# Patient Record
Sex: Male | Born: 1950 | Race: White | Hispanic: No | Marital: Married | Smoking: Former smoker
Health system: Southern US, Community
[De-identification: ages and names within clinical notes are randomized; demographics above are authoritative.]

## PROBLEM LIST (undated history)

## (undated) DIAGNOSIS — E119 Type 2 diabetes mellitus without complications: Secondary | ICD-10-CM

## (undated) DIAGNOSIS — I10 Essential (primary) hypertension: Secondary | ICD-10-CM

## (undated) DIAGNOSIS — E785 Hyperlipidemia, unspecified: Secondary | ICD-10-CM

## (undated) HISTORY — PX: BACK SURGERY: SHX140

## (undated) SURGERY — Surgical Case
Anesthesia: *Unknown

---

## 1998-04-12 ENCOUNTER — Encounter: Payer: Self-pay | Admitting: Orthopedic Surgery

## 1998-04-19 ENCOUNTER — Observation Stay (HOSPITAL_COMMUNITY): Admission: RE | Admit: 1998-04-19 | Discharge: 1998-04-20 | Payer: Self-pay | Admitting: Orthopedic Surgery

## 1998-04-19 ENCOUNTER — Encounter: Payer: Self-pay | Admitting: Orthopedic Surgery

## 2002-09-19 ENCOUNTER — Ambulatory Visit (HOSPITAL_COMMUNITY): Admission: RE | Admit: 2002-09-19 | Discharge: 2002-09-19 | Payer: Self-pay | Admitting: Gastroenterology

## 2010-02-18 ENCOUNTER — Observation Stay (HOSPITAL_COMMUNITY): Admission: EM | Admit: 2010-02-18 | Discharge: 2010-02-20 | Payer: Self-pay | Admitting: Emergency Medicine

## 2010-02-19 ENCOUNTER — Encounter (INDEPENDENT_AMBULATORY_CARE_PROVIDER_SITE_OTHER): Payer: Self-pay | Admitting: Internal Medicine

## 2010-02-19 ENCOUNTER — Ambulatory Visit: Payer: Self-pay | Admitting: Vascular Surgery

## 2010-09-30 LAB — CBC
HCT: 39.8 % (ref 39.0–52.0)
Hemoglobin: 12.9 g/dL — ABNORMAL LOW (ref 13.0–17.0)
Hemoglobin: 13.9 g/dL (ref 13.0–17.0)
MCH: 30.3 pg (ref 26.0–34.0)
MCHC: 34.9 g/dL (ref 30.0–36.0)
MCV: 86.9 fL (ref 78.0–100.0)
Platelets: 177 10*3/uL (ref 150–400)
Platelets: 182 10*3/uL (ref 150–400)
RBC: 4.32 MIL/uL (ref 4.22–5.81)
RBC: 4.58 MIL/uL (ref 4.22–5.81)
RDW: 12.7 % (ref 11.5–15.5)
WBC: 6.6 10*3/uL (ref 4.0–10.5)
WBC: 6.8 10*3/uL (ref 4.0–10.5)

## 2010-09-30 LAB — COMPREHENSIVE METABOLIC PANEL
ALT: 18 U/L (ref 0–53)
AST: 16 U/L (ref 0–37)
Albumin: 3.3 g/dL — ABNORMAL LOW (ref 3.5–5.2)
CO2: 26 mEq/L (ref 19–32)
Chloride: 108 mEq/L (ref 96–112)
GFR calc Af Amer: >60 mL/min (ref >60)
GFR calc non Af Amer: >60 mL/min (ref >60)
Sodium: 141 mEq/L (ref 135–145)
Total Bilirubin: 0.6 mg/dL (ref 0.3–1.2)

## 2010-09-30 LAB — LIPID PANEL
Cholesterol: 92 mg/dL (ref 0–200)
HDL: 28 mg/dL — ABNORMAL LOW (ref >39)
Total CHOL/HDL Ratio: 3.3 RATIO
Triglycerides: 145 mg/dL (ref <150)

## 2010-09-30 LAB — URINALYSIS, ROUTINE W REFLEX MICROSCOPIC
Hgb urine dipstick: NEGATIVE
Leukocytes, UA: NEGATIVE
Nitrite: NEGATIVE
Specific Gravity, Urine: 1.031 — ABNORMAL HIGH (ref 1.005–1.030)
Urobilinogen, UA: 1 mg/dL (ref 0.0–1.0)

## 2010-09-30 LAB — BASIC METABOLIC PANEL
BUN: 19 mg/dL (ref 6–23)
CO2: 25 mEq/L (ref 19–32)
Calcium: 9.7 mg/dL (ref 8.4–10.5)
Chloride: 104 mEq/L (ref 96–112)
Creatinine, Ser: 1.19 mg/dL (ref 0.4–1.5)
GFR calc Af Amer: >60 mL/min (ref >60)
GFR calc non Af Amer: >60 mL/min (ref >60)
Glucose, Bld: 294 mg/dL — ABNORMAL HIGH (ref 70–99)
Potassium: 4 mEq/L (ref 3.5–5.1)
Sodium: 137 mEq/L (ref 135–145)

## 2010-09-30 LAB — CK TOTAL AND CKMB (NOT AT ARMC)
CK, MB: 5.5 ng/mL — ABNORMAL HIGH (ref 0.3–4.0)
Relative Index: 2 (ref 0.0–2.5)

## 2010-09-30 LAB — RAPID URINE DRUG SCREEN, HOSP PERFORMED
Amphetamines: NOT DETECTED
Barbiturates: NOT DETECTED
Tetrahydrocannabinol: NOT DETECTED

## 2010-09-30 LAB — DIFFERENTIAL
Basophils Absolute: 0 10*3/uL (ref 0.0–0.1)
Basophils Relative: 0 % (ref 0–1)
Eosinophils Absolute: 0.1 10*3/uL (ref 0.0–0.7)
Eosinophils Relative: 2 % (ref 0–5)
Lymphocytes Relative: 22 % (ref 12–46)
Lymphs Abs: 1.5 10*3/uL (ref 0.7–4.0)
Monocytes Absolute: 0.6 10*3/uL (ref 0.1–1.0)
Monocytes Relative: 9 % (ref 3–12)
Neutro Abs: 4.5 10*3/uL (ref 1.7–7.7)
Neutrophils Relative %: 67 % (ref 43–77)

## 2010-09-30 LAB — CARDIAC PANEL(CRET KIN+CKTOT+MB+TROPI)
CK, MB: 3.8 ng/mL (ref 0.3–4.0)
Relative Index: 1.9 (ref 0.0–2.5)
Total CK: 200 U/L (ref 7–232)

## 2010-09-30 LAB — TROPONIN I: Troponin I: 0.01 ng/mL (ref 0.00–0.06)

## 2010-09-30 LAB — TSH: TSH: 0.992 u[IU]/mL (ref 0.350–4.500)

## 2010-09-30 LAB — GLUCOSE, CAPILLARY
Glucose-Capillary: 117 mg/dL — ABNORMAL HIGH (ref 70–99)
Glucose-Capillary: 178 mg/dL — ABNORMAL HIGH (ref 70–99)
Glucose-Capillary: 183 mg/dL — ABNORMAL HIGH (ref 70–99)
Glucose-Capillary: 201 mg/dL — ABNORMAL HIGH (ref 70–99)

## 2010-09-30 LAB — URINE MICROSCOPIC-ADD ON

## 2010-09-30 LAB — POCT CARDIAC MARKERS: Troponin i, poc: <0.05 ng/mL (ref 0.00–0.09)

## 2010-12-02 NOTE — Op Note (Signed)
   Aaron [name], [name]                        ACCOUNT NO.:  [id number]   MEDICAL RECORD NO.:  [id number]                   PATIENT TYPE:  AMB   LOCATION:  ENDO                                 FACILITY:  [facility]   PHYSICIAN:  James L. Malon Kindle., M.D.          DATE OF BIRTH:  11-Sep-1950   DATE OF PROCEDURE:  09/19/2002  DATE OF DISCHARGE:                                 OPERATIVE REPORT   PROCEDURE:  Colonoscopy.   MEDICATIONS:  Fentanyl 100 mcg, Versed 8 mg IV.   SCOPE:  Olympus pediatric colonoscope.   INDICATIONS FOR PROCEDURE:  Colon cancer screening.   DESCRIPTION OF PROCEDURE:  The procedure had been explained to the patient  and consent obtained. With the patient in the left lateral decubitus  position, a digital exam was performed and the scope inserted and advanced.  We easily reached the cecum without difficulty, ileocecal valve and  appendiceal orifice seen. Scope withdrawn. The cecum, ascending colon,  transverse colon, descending and sigmoid colon were seen well with no  significant diverticular disease. The rectum was free of polyps. The scope  was withdrawn. The patient tolerated the procedure well.   ASSESSMENT:  1. Normal screening colonoscopy.   PLAN:  Routine followup with yearly Hemoccults, possibly screen the colon  again in 10 years.                                               James L. Malon Kindle., M.D.    Waldron Session  D:  09/19/2002  T:  09/19/2002  Job:  [id number]   cc:   Oley Balm. Georgina Pillion, M.D.  [address]  [place]  [place] [address]  Fax: [phone]

## 2011-06-06 ENCOUNTER — Ambulatory Visit (INDEPENDENT_AMBULATORY_CARE_PROVIDER_SITE_OTHER): Payer: 59 | Admitting: Ophthalmology

## 2011-06-06 DIAGNOSIS — H2510 Age-related nuclear cataract, unspecified eye: Secondary | ICD-10-CM

## 2011-06-06 DIAGNOSIS — H43819 Vitreous degeneration, unspecified eye: Secondary | ICD-10-CM

## 2011-06-06 DIAGNOSIS — E1165 Type 2 diabetes mellitus with hyperglycemia: Secondary | ICD-10-CM

## 2011-06-06 DIAGNOSIS — E11319 Type 2 diabetes mellitus with unspecified diabetic retinopathy without macular edema: Secondary | ICD-10-CM

## 2011-12-04 ENCOUNTER — Ambulatory Visit (INDEPENDENT_AMBULATORY_CARE_PROVIDER_SITE_OTHER): Payer: 59 | Admitting: Ophthalmology

## 2011-12-04 DIAGNOSIS — E1139 Type 2 diabetes mellitus with other diabetic ophthalmic complication: Secondary | ICD-10-CM

## 2011-12-04 DIAGNOSIS — H43819 Vitreous degeneration, unspecified eye: Secondary | ICD-10-CM

## 2011-12-04 DIAGNOSIS — H2510 Age-related nuclear cataract, unspecified eye: Secondary | ICD-10-CM

## 2011-12-04 DIAGNOSIS — E11319 Type 2 diabetes mellitus with unspecified diabetic retinopathy without macular edema: Secondary | ICD-10-CM

## 2012-06-05 ENCOUNTER — Ambulatory Visit (INDEPENDENT_AMBULATORY_CARE_PROVIDER_SITE_OTHER): Payer: 59 | Admitting: Ophthalmology

## 2012-06-12 ENCOUNTER — Ambulatory Visit (INDEPENDENT_AMBULATORY_CARE_PROVIDER_SITE_OTHER): Payer: 59 | Admitting: Ophthalmology

## 2012-06-26 ENCOUNTER — Ambulatory Visit (INDEPENDENT_AMBULATORY_CARE_PROVIDER_SITE_OTHER): Payer: 59 | Admitting: Ophthalmology

## 2012-06-26 DIAGNOSIS — H35039 Hypertensive retinopathy, unspecified eye: Secondary | ICD-10-CM

## 2012-06-26 DIAGNOSIS — H43819 Vitreous degeneration, unspecified eye: Secondary | ICD-10-CM

## 2012-06-26 DIAGNOSIS — E11319 Type 2 diabetes mellitus with unspecified diabetic retinopathy without macular edema: Secondary | ICD-10-CM

## 2012-06-26 DIAGNOSIS — E1165 Type 2 diabetes mellitus with hyperglycemia: Secondary | ICD-10-CM

## 2012-06-26 DIAGNOSIS — H2510 Age-related nuclear cataract, unspecified eye: Secondary | ICD-10-CM

## 2012-06-26 DIAGNOSIS — I10 Essential (primary) hypertension: Secondary | ICD-10-CM

## 2012-12-25 ENCOUNTER — Ambulatory Visit (INDEPENDENT_AMBULATORY_CARE_PROVIDER_SITE_OTHER): Payer: PRIVATE HEALTH INSURANCE | Admitting: Ophthalmology

## 2012-12-25 DIAGNOSIS — D3130 Benign neoplasm of unspecified choroid: Secondary | ICD-10-CM

## 2012-12-25 DIAGNOSIS — I10 Essential (primary) hypertension: Secondary | ICD-10-CM

## 2012-12-25 DIAGNOSIS — H35039 Hypertensive retinopathy, unspecified eye: Secondary | ICD-10-CM

## 2012-12-25 DIAGNOSIS — E11319 Type 2 diabetes mellitus with unspecified diabetic retinopathy without macular edema: Secondary | ICD-10-CM

## 2012-12-25 DIAGNOSIS — H2510 Age-related nuclear cataract, unspecified eye: Secondary | ICD-10-CM

## 2012-12-25 DIAGNOSIS — E1165 Type 2 diabetes mellitus with hyperglycemia: Secondary | ICD-10-CM

## 2013-06-26 ENCOUNTER — Ambulatory Visit (INDEPENDENT_AMBULATORY_CARE_PROVIDER_SITE_OTHER): Payer: BC Managed Care – PPO | Admitting: Ophthalmology

## 2013-06-26 DIAGNOSIS — E11319 Type 2 diabetes mellitus with unspecified diabetic retinopathy without macular edema: Secondary | ICD-10-CM

## 2013-06-26 DIAGNOSIS — D3130 Benign neoplasm of unspecified choroid: Secondary | ICD-10-CM

## 2013-06-26 DIAGNOSIS — H43819 Vitreous degeneration, unspecified eye: Secondary | ICD-10-CM

## 2013-06-26 DIAGNOSIS — E1139 Type 2 diabetes mellitus with other diabetic ophthalmic complication: Secondary | ICD-10-CM

## 2013-06-26 DIAGNOSIS — H35039 Hypertensive retinopathy, unspecified eye: Secondary | ICD-10-CM

## 2013-06-26 DIAGNOSIS — I10 Essential (primary) hypertension: Secondary | ICD-10-CM

## 2013-12-25 ENCOUNTER — Ambulatory Visit (INDEPENDENT_AMBULATORY_CARE_PROVIDER_SITE_OTHER): Payer: BC Managed Care – PPO | Admitting: Ophthalmology

## 2014-01-05 ENCOUNTER — Ambulatory Visit (INDEPENDENT_AMBULATORY_CARE_PROVIDER_SITE_OTHER): Payer: BC Managed Care – PPO | Admitting: Ophthalmology

## 2014-01-05 DIAGNOSIS — H43819 Vitreous degeneration, unspecified eye: Secondary | ICD-10-CM

## 2014-01-05 DIAGNOSIS — H35039 Hypertensive retinopathy, unspecified eye: Secondary | ICD-10-CM

## 2014-01-05 DIAGNOSIS — E1165 Type 2 diabetes mellitus with hyperglycemia: Secondary | ICD-10-CM

## 2014-01-05 DIAGNOSIS — I10 Essential (primary) hypertension: Secondary | ICD-10-CM

## 2014-01-05 DIAGNOSIS — D3130 Benign neoplasm of unspecified choroid: Secondary | ICD-10-CM

## 2014-01-05 DIAGNOSIS — E1139 Type 2 diabetes mellitus with other diabetic ophthalmic complication: Secondary | ICD-10-CM

## 2014-01-05 DIAGNOSIS — E11319 Type 2 diabetes mellitus with unspecified diabetic retinopathy without macular edema: Secondary | ICD-10-CM

## 2014-07-07 ENCOUNTER — Ambulatory Visit (INDEPENDENT_AMBULATORY_CARE_PROVIDER_SITE_OTHER): Payer: Medicare Other | Admitting: Ophthalmology

## 2014-07-07 DIAGNOSIS — D3132 Benign neoplasm of left choroid: Secondary | ICD-10-CM | POA: Diagnosis not present

## 2014-07-07 DIAGNOSIS — H43813 Vitreous degeneration, bilateral: Secondary | ICD-10-CM | POA: Diagnosis not present

## 2014-07-07 DIAGNOSIS — H2513 Age-related nuclear cataract, bilateral: Secondary | ICD-10-CM | POA: Diagnosis not present

## 2014-07-07 DIAGNOSIS — I10 Essential (primary) hypertension: Secondary | ICD-10-CM | POA: Diagnosis not present

## 2014-07-07 DIAGNOSIS — E11329 Type 2 diabetes mellitus with mild nonproliferative diabetic retinopathy without macular edema: Secondary | ICD-10-CM | POA: Diagnosis not present

## 2014-07-07 DIAGNOSIS — E1165 Type 2 diabetes mellitus with hyperglycemia: Secondary | ICD-10-CM | POA: Diagnosis not present

## 2014-07-07 DIAGNOSIS — H35033 Hypertensive retinopathy, bilateral: Secondary | ICD-10-CM | POA: Diagnosis not present

## 2014-08-19 DIAGNOSIS — E1165 Type 2 diabetes mellitus with hyperglycemia: Secondary | ICD-10-CM | POA: Diagnosis not present

## 2015-01-06 ENCOUNTER — Ambulatory Visit (INDEPENDENT_AMBULATORY_CARE_PROVIDER_SITE_OTHER): Payer: Medicare Other | Admitting: Ophthalmology

## 2015-01-06 DIAGNOSIS — H43813 Vitreous degeneration, bilateral: Secondary | ICD-10-CM

## 2015-01-06 DIAGNOSIS — I10 Essential (primary) hypertension: Secondary | ICD-10-CM

## 2015-01-06 DIAGNOSIS — D3132 Benign neoplasm of left choroid: Secondary | ICD-10-CM

## 2015-01-06 DIAGNOSIS — E11329 Type 2 diabetes mellitus with mild nonproliferative diabetic retinopathy without macular edema: Secondary | ICD-10-CM

## 2015-01-06 DIAGNOSIS — E11339 Type 2 diabetes mellitus with moderate nonproliferative diabetic retinopathy without macular edema: Secondary | ICD-10-CM | POA: Diagnosis not present

## 2015-01-06 DIAGNOSIS — E11311 Type 2 diabetes mellitus with unspecified diabetic retinopathy with macular edema: Secondary | ICD-10-CM

## 2015-03-10 DIAGNOSIS — E780 Pure hypercholesterolemia: Secondary | ICD-10-CM | POA: Diagnosis not present

## 2015-03-10 DIAGNOSIS — E1165 Type 2 diabetes mellitus with hyperglycemia: Secondary | ICD-10-CM | POA: Diagnosis not present

## 2015-03-10 DIAGNOSIS — E11319 Type 2 diabetes mellitus with unspecified diabetic retinopathy without macular edema: Secondary | ICD-10-CM | POA: Diagnosis not present

## 2015-03-10 DIAGNOSIS — I10 Essential (primary) hypertension: Secondary | ICD-10-CM | POA: Diagnosis not present

## 2015-03-11 DIAGNOSIS — E780 Pure hypercholesterolemia: Secondary | ICD-10-CM | POA: Diagnosis not present

## 2015-03-11 DIAGNOSIS — E1165 Type 2 diabetes mellitus with hyperglycemia: Secondary | ICD-10-CM | POA: Diagnosis not present

## 2015-07-08 ENCOUNTER — Ambulatory Visit (INDEPENDENT_AMBULATORY_CARE_PROVIDER_SITE_OTHER): Payer: Medicare Other | Admitting: Ophthalmology

## 2015-07-28 ENCOUNTER — Ambulatory Visit (INDEPENDENT_AMBULATORY_CARE_PROVIDER_SITE_OTHER): Payer: Medicare Other | Admitting: Ophthalmology

## 2015-07-29 DIAGNOSIS — E1165 Type 2 diabetes mellitus with hyperglycemia: Secondary | ICD-10-CM | POA: Diagnosis not present

## 2015-07-29 DIAGNOSIS — E11319 Type 2 diabetes mellitus with unspecified diabetic retinopathy without macular edema: Secondary | ICD-10-CM | POA: Diagnosis not present

## 2015-07-29 DIAGNOSIS — I10 Essential (primary) hypertension: Secondary | ICD-10-CM | POA: Diagnosis not present

## 2015-07-29 DIAGNOSIS — E7800 Pure hypercholesterolemia, unspecified: Secondary | ICD-10-CM | POA: Diagnosis not present

## 2015-08-11 ENCOUNTER — Ambulatory Visit (INDEPENDENT_AMBULATORY_CARE_PROVIDER_SITE_OTHER): Payer: Medicare Other | Admitting: Ophthalmology

## 2015-08-11 DIAGNOSIS — E11319 Type 2 diabetes mellitus with unspecified diabetic retinopathy without macular edema: Secondary | ICD-10-CM | POA: Diagnosis not present

## 2015-08-11 DIAGNOSIS — H35033 Hypertensive retinopathy, bilateral: Secondary | ICD-10-CM

## 2015-08-11 DIAGNOSIS — D3132 Benign neoplasm of left choroid: Secondary | ICD-10-CM

## 2015-08-11 DIAGNOSIS — H2513 Age-related nuclear cataract, bilateral: Secondary | ICD-10-CM | POA: Diagnosis not present

## 2015-08-11 DIAGNOSIS — H43813 Vitreous degeneration, bilateral: Secondary | ICD-10-CM

## 2015-08-11 DIAGNOSIS — I10 Essential (primary) hypertension: Secondary | ICD-10-CM

## 2015-08-11 DIAGNOSIS — E113393 Type 2 diabetes mellitus with moderate nonproliferative diabetic retinopathy without macular edema, bilateral: Secondary | ICD-10-CM

## 2015-11-03 DIAGNOSIS — E7800 Pure hypercholesterolemia, unspecified: Secondary | ICD-10-CM | POA: Diagnosis not present

## 2015-11-03 DIAGNOSIS — E1165 Type 2 diabetes mellitus with hyperglycemia: Secondary | ICD-10-CM | POA: Diagnosis not present

## 2015-11-03 DIAGNOSIS — E11319 Type 2 diabetes mellitus with unspecified diabetic retinopathy without macular edema: Secondary | ICD-10-CM | POA: Diagnosis not present

## 2015-11-03 DIAGNOSIS — I10 Essential (primary) hypertension: Secondary | ICD-10-CM | POA: Diagnosis not present

## 2015-11-10 DIAGNOSIS — E1165 Type 2 diabetes mellitus with hyperglycemia: Secondary | ICD-10-CM | POA: Diagnosis not present

## 2016-02-09 ENCOUNTER — Ambulatory Visit (INDEPENDENT_AMBULATORY_CARE_PROVIDER_SITE_OTHER): Payer: Medicare Other | Admitting: Ophthalmology

## 2016-03-09 ENCOUNTER — Ambulatory Visit (INDEPENDENT_AMBULATORY_CARE_PROVIDER_SITE_OTHER): Payer: Medicare Other | Admitting: Ophthalmology

## 2016-03-09 DIAGNOSIS — E113291 Type 2 diabetes mellitus with mild nonproliferative diabetic retinopathy without macular edema, right eye: Secondary | ICD-10-CM

## 2016-03-09 DIAGNOSIS — E11319 Type 2 diabetes mellitus with unspecified diabetic retinopathy without macular edema: Secondary | ICD-10-CM

## 2016-03-09 DIAGNOSIS — I10 Essential (primary) hypertension: Secondary | ICD-10-CM | POA: Diagnosis not present

## 2016-03-09 DIAGNOSIS — H43813 Vitreous degeneration, bilateral: Secondary | ICD-10-CM | POA: Diagnosis not present

## 2016-03-09 DIAGNOSIS — H35033 Hypertensive retinopathy, bilateral: Secondary | ICD-10-CM

## 2016-03-09 DIAGNOSIS — E113312 Type 2 diabetes mellitus with moderate nonproliferative diabetic retinopathy with macular edema, left eye: Secondary | ICD-10-CM | POA: Diagnosis not present

## 2016-03-09 DIAGNOSIS — D3132 Benign neoplasm of left choroid: Secondary | ICD-10-CM

## 2016-04-07 DIAGNOSIS — E7800 Pure hypercholesterolemia, unspecified: Secondary | ICD-10-CM | POA: Diagnosis not present

## 2016-04-07 DIAGNOSIS — I10 Essential (primary) hypertension: Secondary | ICD-10-CM | POA: Diagnosis not present

## 2016-04-07 DIAGNOSIS — E11319 Type 2 diabetes mellitus with unspecified diabetic retinopathy without macular edema: Secondary | ICD-10-CM | POA: Diagnosis not present

## 2016-04-07 DIAGNOSIS — E1165 Type 2 diabetes mellitus with hyperglycemia: Secondary | ICD-10-CM | POA: Diagnosis not present

## 2016-04-07 DIAGNOSIS — E669 Obesity, unspecified: Secondary | ICD-10-CM | POA: Diagnosis not present

## 2016-07-18 ENCOUNTER — Ambulatory Visit (INDEPENDENT_AMBULATORY_CARE_PROVIDER_SITE_OTHER): Payer: Medicare Other | Admitting: Ophthalmology

## 2016-08-07 DIAGNOSIS — E11319 Type 2 diabetes mellitus with unspecified diabetic retinopathy without macular edema: Secondary | ICD-10-CM | POA: Diagnosis not present

## 2016-08-07 DIAGNOSIS — E7800 Pure hypercholesterolemia, unspecified: Secondary | ICD-10-CM | POA: Diagnosis not present

## 2016-08-07 DIAGNOSIS — E1165 Type 2 diabetes mellitus with hyperglycemia: Secondary | ICD-10-CM | POA: Diagnosis not present

## 2016-08-07 DIAGNOSIS — I10 Essential (primary) hypertension: Secondary | ICD-10-CM | POA: Diagnosis not present

## 2017-02-06 DIAGNOSIS — E7800 Pure hypercholesterolemia, unspecified: Secondary | ICD-10-CM | POA: Diagnosis not present

## 2017-02-06 DIAGNOSIS — I10 Essential (primary) hypertension: Secondary | ICD-10-CM | POA: Diagnosis not present

## 2017-02-06 DIAGNOSIS — E11319 Type 2 diabetes mellitus with unspecified diabetic retinopathy without macular edema: Secondary | ICD-10-CM | POA: Diagnosis not present

## 2017-02-06 DIAGNOSIS — E1165 Type 2 diabetes mellitus with hyperglycemia: Secondary | ICD-10-CM | POA: Diagnosis not present

## 2017-08-30 DIAGNOSIS — E669 Obesity, unspecified: Secondary | ICD-10-CM | POA: Diagnosis not present

## 2017-08-30 DIAGNOSIS — N529 Male erectile dysfunction, unspecified: Secondary | ICD-10-CM | POA: Diagnosis not present

## 2017-08-30 DIAGNOSIS — E1165 Type 2 diabetes mellitus with hyperglycemia: Secondary | ICD-10-CM | POA: Diagnosis not present

## 2017-08-30 DIAGNOSIS — I10 Essential (primary) hypertension: Secondary | ICD-10-CM | POA: Diagnosis not present

## 2017-08-30 DIAGNOSIS — E11319 Type 2 diabetes mellitus with unspecified diabetic retinopathy without macular edema: Secondary | ICD-10-CM | POA: Diagnosis not present

## 2017-08-30 DIAGNOSIS — E1140 Type 2 diabetes mellitus with diabetic neuropathy, unspecified: Secondary | ICD-10-CM | POA: Diagnosis not present

## 2017-08-30 DIAGNOSIS — E7800 Pure hypercholesterolemia, unspecified: Secondary | ICD-10-CM | POA: Diagnosis not present

## 2018-01-29 DIAGNOSIS — E1140 Type 2 diabetes mellitus with diabetic neuropathy, unspecified: Secondary | ICD-10-CM | POA: Diagnosis not present

## 2018-01-29 DIAGNOSIS — I10 Essential (primary) hypertension: Secondary | ICD-10-CM | POA: Diagnosis not present

## 2018-01-29 DIAGNOSIS — E1165 Type 2 diabetes mellitus with hyperglycemia: Secondary | ICD-10-CM | POA: Diagnosis not present

## 2018-01-29 DIAGNOSIS — E7800 Pure hypercholesterolemia, unspecified: Secondary | ICD-10-CM | POA: Diagnosis not present

## 2018-02-11 DIAGNOSIS — Z125 Encounter for screening for malignant neoplasm of prostate: Secondary | ICD-10-CM | POA: Diagnosis not present

## 2018-02-11 DIAGNOSIS — E7800 Pure hypercholesterolemia, unspecified: Secondary | ICD-10-CM | POA: Diagnosis not present

## 2018-02-11 DIAGNOSIS — N390 Urinary tract infection, site not specified: Secondary | ICD-10-CM | POA: Diagnosis not present

## 2018-02-11 DIAGNOSIS — E1165 Type 2 diabetes mellitus with hyperglycemia: Secondary | ICD-10-CM | POA: Diagnosis not present

## 2018-02-11 DIAGNOSIS — I10 Essential (primary) hypertension: Secondary | ICD-10-CM | POA: Diagnosis not present

## 2018-02-18 DIAGNOSIS — E1140 Type 2 diabetes mellitus with diabetic neuropathy, unspecified: Secondary | ICD-10-CM | POA: Diagnosis not present

## 2018-02-18 DIAGNOSIS — I10 Essential (primary) hypertension: Secondary | ICD-10-CM | POA: Diagnosis not present

## 2018-02-18 DIAGNOSIS — E7800 Pure hypercholesterolemia, unspecified: Secondary | ICD-10-CM | POA: Diagnosis not present

## 2018-02-18 DIAGNOSIS — E1165 Type 2 diabetes mellitus with hyperglycemia: Secondary | ICD-10-CM | POA: Diagnosis not present

## 2018-07-23 DIAGNOSIS — E669 Obesity, unspecified: Secondary | ICD-10-CM | POA: Diagnosis not present

## 2018-07-23 DIAGNOSIS — E7800 Pure hypercholesterolemia, unspecified: Secondary | ICD-10-CM | POA: Diagnosis not present

## 2018-07-23 DIAGNOSIS — I10 Essential (primary) hypertension: Secondary | ICD-10-CM | POA: Diagnosis not present

## 2018-07-23 DIAGNOSIS — N529 Male erectile dysfunction, unspecified: Secondary | ICD-10-CM | POA: Diagnosis not present

## 2018-07-23 DIAGNOSIS — E11319 Type 2 diabetes mellitus with unspecified diabetic retinopathy without macular edema: Secondary | ICD-10-CM | POA: Diagnosis not present

## 2018-07-23 DIAGNOSIS — E1140 Type 2 diabetes mellitus with diabetic neuropathy, unspecified: Secondary | ICD-10-CM | POA: Diagnosis not present

## 2018-07-23 DIAGNOSIS — Z23 Encounter for immunization: Secondary | ICD-10-CM | POA: Diagnosis not present

## 2018-07-23 DIAGNOSIS — E1165 Type 2 diabetes mellitus with hyperglycemia: Secondary | ICD-10-CM | POA: Diagnosis not present

## 2019-01-16 DIAGNOSIS — E7800 Pure hypercholesterolemia, unspecified: Secondary | ICD-10-CM | POA: Diagnosis not present

## 2019-01-16 DIAGNOSIS — E1165 Type 2 diabetes mellitus with hyperglycemia: Secondary | ICD-10-CM | POA: Diagnosis not present

## 2019-01-23 DIAGNOSIS — E11319 Type 2 diabetes mellitus with unspecified diabetic retinopathy without macular edema: Secondary | ICD-10-CM | POA: Diagnosis not present

## 2019-01-23 DIAGNOSIS — E669 Obesity, unspecified: Secondary | ICD-10-CM | POA: Diagnosis not present

## 2019-01-23 DIAGNOSIS — E7800 Pure hypercholesterolemia, unspecified: Secondary | ICD-10-CM | POA: Diagnosis not present

## 2019-01-23 DIAGNOSIS — I10 Essential (primary) hypertension: Secondary | ICD-10-CM | POA: Diagnosis not present

## 2019-01-23 DIAGNOSIS — E1165 Type 2 diabetes mellitus with hyperglycemia: Secondary | ICD-10-CM | POA: Diagnosis not present

## 2019-01-23 DIAGNOSIS — E1140 Type 2 diabetes mellitus with diabetic neuropathy, unspecified: Secondary | ICD-10-CM | POA: Diagnosis not present

## 2019-01-23 DIAGNOSIS — N529 Male erectile dysfunction, unspecified: Secondary | ICD-10-CM | POA: Diagnosis not present

## 2019-06-10 DIAGNOSIS — E1140 Type 2 diabetes mellitus with diabetic neuropathy, unspecified: Secondary | ICD-10-CM | POA: Diagnosis not present

## 2019-06-10 DIAGNOSIS — Z23 Encounter for immunization: Secondary | ICD-10-CM | POA: Diagnosis not present

## 2019-06-10 DIAGNOSIS — I10 Essential (primary) hypertension: Secondary | ICD-10-CM | POA: Diagnosis not present

## 2019-06-10 DIAGNOSIS — E1165 Type 2 diabetes mellitus with hyperglycemia: Secondary | ICD-10-CM | POA: Diagnosis not present

## 2019-06-10 DIAGNOSIS — E669 Obesity, unspecified: Secondary | ICD-10-CM | POA: Diagnosis not present

## 2019-06-10 DIAGNOSIS — N529 Male erectile dysfunction, unspecified: Secondary | ICD-10-CM | POA: Diagnosis not present

## 2019-06-10 DIAGNOSIS — E11319 Type 2 diabetes mellitus with unspecified diabetic retinopathy without macular edema: Secondary | ICD-10-CM | POA: Diagnosis not present

## 2019-06-10 DIAGNOSIS — E7800 Pure hypercholesterolemia, unspecified: Secondary | ICD-10-CM | POA: Diagnosis not present

## 2019-06-13 ENCOUNTER — Ambulatory Visit
Admission: EM | Admit: 2019-06-13 | Discharge: 2019-06-13 | Disposition: A | Discharge status: Home / Self Care | Payer: Medicare Other

## 2019-06-13 ENCOUNTER — Encounter: Payer: Self-pay | Admitting: Emergency Medicine

## 2019-06-13 ENCOUNTER — Other Ambulatory Visit: Payer: Self-pay

## 2019-06-13 DIAGNOSIS — R310 Gross hematuria: Secondary | ICD-10-CM

## 2019-06-13 HISTORY — DX: Essential (primary) hypertension: I10

## 2019-06-13 HISTORY — DX: Hyperlipidemia, unspecified: E78.5

## 2019-06-13 LAB — POCT URINALYSIS DIP (MANUAL ENTRY)
Bilirubin, UA: NEGATIVE
Glucose, UA: >=1000 mg/dL — AB
Leukocytes, UA: NEGATIVE
Nitrite, UA: NEGATIVE
Protein Ur, POC: 100 mg/dL — AB
Spec Grav, UA: 1.02 (ref 1.010–1.025)
Urobilinogen, UA: 1 E.U./dL
pH, UA: 6 (ref 5.0–8.0)

## 2019-06-13 LAB — POCT FASTING CBG KUC MANUAL ENTRY: POCT Glucose (KUC): 180 mg/dL — AB (ref 70–99)

## 2019-06-13 NOTE — Discharge Instructions (Signed)
Very important to drink plenty of water, avoid NSAIDs, and follow-up with urology. In the interim go to ER if you develop fever, abdominal pain, back pain, inability to urinate, worsening blood in your urine with fatigue, malaise.

## 2019-06-13 NOTE — ED Provider Notes (Signed)
[name]    CSN: [id number] Arrival date & time: 06/13/19  1056      History   Chief Complaint Chief Complaint  Patient presents with  . APPT: 11am  . Hematuria    HPI Aaron [name] is a 68 y.o. male with history of type 2 diabetes, hypertension presenting for gross hematuria since this morning.  Patient denies dysuria, urinary frequency, weakened stream, rectal pain, abdominal pain, back pain.  No history of kidney stones, pyelonephritis.  Patient has been afebrile, feels well otherwise.  Patient checks his sugars routinely, though did not this morning.  Patient denies polyuria, polydipsia, polyphagia.  States he had his A1c drawn this past Thursday: 7.5%.  No recent change in medications.   Past Medical History:  Diagnosis Date  . Hyperlipidemia   . Hypertension     There are no active problems to display for this patient.   Past Surgical History:  Procedure Laterality Date  . BACK SURGERY         Home Medications    Prior to Admission medications   Medication Sig Start Date End Date Taking? Authorizing Provider  Canagliflozin-metFORMIN HCl (INVOKAMET) 50-1000 MG TABS Take by mouth.   Yes [provider]  insulin NPH-regular Human (70-30) 100 UNIT/ML injection Inject into the skin. 50 units am, 20 units for lunch, 50 units at night   Yes [provider]  lisinopril-hydrochlorothiazide (ZESTORETIC) 20-25 MG tablet Take 1 tablet by mouth daily.   Yes [provider]  pregabalin (LYRICA) 50 MG capsule Take 50 mg by mouth 3 (three) times daily.   Yes [provider]  simvastatin (ZOCOR) 20 MG tablet Take 20 mg by mouth daily.   Yes [provider]    Family History Family History  Problem Relation Age of Onset  . Diabetes Father     Social History Social History   Tobacco Use  . Smoking status: Former Research scientist (life sciences)  . Smokeless tobacco: Never Used  Substance Use Topics  . Alcohol use: Not Currently     Comment: one every 6 months  . Drug use: Never     Allergies   Patient has no known allergies.   Review of Systems Review of Systems  Constitutional: Negative for fatigue and fever.  Respiratory: Negative for cough and shortness of breath.   Cardiovascular: Negative for chest pain and palpitations.  Gastrointestinal: Negative for abdominal pain, diarrhea and vomiting.  Genitourinary: Positive for hematuria. Negative for discharge, dysuria, frequency, genital sores, penile pain, penile swelling, scrotal swelling, testicular pain and urgency.  Musculoskeletal: Negative for arthralgias and myalgias.  Skin: Negative for rash and wound.  Neurological: Negative for speech difficulty and headaches.  All other systems reviewed and are negative.    Physical Exam Triage Vital Signs ED Triage Vitals  Enc Vitals Group     BP 06/13/19 1123 133/66     Pulse Rate 06/13/19 1123 78     Resp 06/13/19 1123 16     Temp 06/13/19 1123 98.3 F (36.8 C)     Temp Source 06/13/19 1123 Oral     SpO2 06/13/19 1123 98 %     Weight --      Height --      Head Circumference --      Peak Flow --      Pain Score 06/13/19 1128 0     Pain Loc --      Pain Edu? --      Excl. in  GC? --    No data found.  Updated Vital Signs BP 133/66 (BP Location: Left Arm)   Pulse 78   Temp 98.3 F (36.8 C) (Oral)   Resp 16   SpO2 98%   Visual Acuity Right Eye Distance:   Left Eye Distance:   Bilateral Distance:    Right Eye Near:   Left Eye Near:    Bilateral Near:     Physical Exam Constitutional:      General: He is not in acute distress.    Appearance: He is normal weight. He is not ill-appearing.  HENT:     Head: Normocephalic and atraumatic.     Mouth/Throat:     Mouth: Mucous membranes are moist.     Pharynx: Oropharynx is clear.  Eyes:     General: No scleral icterus.    Conjunctiva/sclera: Conjunctivae normal.     Pupils: Pupils are equal, round, and reactive to light.   Cardiovascular:     Rate and Rhythm: Normal rate.  Pulmonary:     Effort: Pulmonary effort is normal. No respiratory distress.     Breath sounds: No wheezing.  Abdominal:     General: Bowel sounds are normal.     Tenderness: There is no abdominal tenderness. There is no right CVA tenderness or left CVA tenderness.  Skin:    Capillary Refill: Capillary refill takes less than 2 seconds.     Coloration: Skin is not jaundiced or pale.  Neurological:     General: No focal deficit present.     Mental Status: He is alert and oriented to person, place, and time.      UC Treatments / Results  Labs (all labs ordered are listed, but only abnormal results are displayed) Labs Reviewed  POCT URINALYSIS DIP (MANUAL ENTRY) - Abnormal; Notable for the following components:      Result Value   Color, UA red (*)    Clarity, UA cloudy (*)    Glucose, UA >=1,000 (*)    Ketones, POC UA trace (5) (*)    Blood, UA large (*)    Protein Ur, POC =100 (*)    All other components within normal limits  POCT FASTING CBG [facility] MANUAL ENTRY - Abnormal; Notable for the following components:   POCT Glucose ([facility]) 180 (*)    All other components within normal limits    EKG   Radiology No results found.  Procedures Procedures (including critical care time)  Medications Ordered in UC Medications - No data to display  Initial Impression / Assessment and Plan / UC Course  I have reviewed the triage vital signs and the nursing notes.  Pertinent labs & imaging results that were available during my care of the patient were reviewed by me and considered in my medical decision making (see chart for details).     Patient afebrile, nontoxic in office.  Patient otherwise asymptomatic.  POC urine dipstick done office, reviewed by me: Positive for RBC, ketones, glucose, protein.  CBG done in office, patient ate a banana and sandwich for breakfast: 180.  Patient to follow-up with urology, for whom his wife works.   Reviewed strict ER return precautions in the interim.  Patient to focus on hydration, monitoring symptoms. Final Clinical Impressions(s) / UC Diagnoses   Final diagnoses:  Gross hematuria     Discharge Instructions     Very important to drink plenty of water, avoid NSAIDs, and follow-up with urology. In the interim go to ER if  you develop fever, abdominal pain, back pain, inability to urinate, worsening blood in your urine with fatigue, malaise.    ED Prescriptions    None     PDMP not reviewed this encounter.   [place], [place], [place] 06/13/19 1156

## 2019-06-13 NOTE — ED Triage Notes (Signed)
Pt presents to [facility] for assessment of urinating bright red blood since rising this morning.  Denies back pain, penile pain.

## 2019-06-13 NOTE — ED Notes (Signed)
Patient able to ambulate independently  

## 2019-06-16 DIAGNOSIS — R310 Gross hematuria: Secondary | ICD-10-CM | POA: Diagnosis not present

## 2019-06-16 DIAGNOSIS — Z125 Encounter for screening for malignant neoplasm of prostate: Secondary | ICD-10-CM | POA: Diagnosis not present

## 2019-06-16 DIAGNOSIS — N430 Encysted hydrocele: Secondary | ICD-10-CM | POA: Diagnosis not present

## 2019-06-26 DIAGNOSIS — K8020 Calculus of gallbladder without cholecystitis without obstruction: Secondary | ICD-10-CM | POA: Diagnosis not present

## 2019-06-26 DIAGNOSIS — R310 Gross hematuria: Secondary | ICD-10-CM | POA: Diagnosis not present

## 2019-08-17 ENCOUNTER — Ambulatory Visit: Payer: Medicare Other

## 2019-08-25 ENCOUNTER — Ambulatory Visit: Payer: Medicare Other | Attending: Internal Medicine

## 2019-08-25 DIAGNOSIS — Z23 Encounter for immunization: Secondary | ICD-10-CM | POA: Insufficient documentation

## 2019-08-25 NOTE — Progress Notes (Signed)
   Covid-19 Vaccination Clinic  Name:  NARA HASSO    MRN: [id number] DOB: 1950/12/24  08/25/2019  Mr. Fortunato was observed post Covid-19 immunization for 15 minutes without incidence. He was provided with Vaccine Information Sheet and instruction to access the V-Safe system.   Mr. Ogonowski was instructed to call 911 with any severe reactions post vaccine: Marland Kitchen Difficulty breathing  . Swelling of your face and throat  . A fast heartbeat  . A bad rash all over your body  . Dizziness and weakness    Immunizations Administered    Name Date Dose VIS Date Route   Pfizer COVID-19 Vaccine 08/25/2019  2:06 PM 0.3 mL 06/27/2019 Intramuscular   Manufacturer: [place]   Lot: [id number]   [place]: [id number]

## 2019-08-26 DIAGNOSIS — D494 Neoplasm of unspecified behavior of bladder: Secondary | ICD-10-CM | POA: Diagnosis not present

## 2019-08-29 ENCOUNTER — Other Ambulatory Visit: Payer: Self-pay | Admitting: Urology

## 2019-09-01 ENCOUNTER — Other Ambulatory Visit: Payer: Self-pay

## 2019-09-01 ENCOUNTER — Encounter (HOSPITAL_BASED_OUTPATIENT_CLINIC_OR_DEPARTMENT_OTHER): Payer: Self-pay | Admitting: Urology

## 2019-09-01 NOTE — Progress Notes (Signed)
Spoke w/ via phone for pre-op interview---Aaron [name] needs dos---I STAT 8, EKG-              COVID test ------09-04-2019 Arrive at -------1115 AM 09-08-2019 NO FOOD AFTER MIDNIGHT, CLEAR LIQUIDS FROM MIDNIGHT UNTIL 715 AM THEN NPO Medications to take morning of surgery -----NONE Diabetic medication -----TAKE 1/2 DOSE HS 70/30 INSULIN ON 09-07-2019, NO DM MEDICATIONS DAY OF SURGERY Patient Special Instructions ----- Pre-Op special Istructions ----- Patient verbalized understanding of instructions that were given at this phone interview. Patient denies shortness of breath, chest pain, fever, cough a this phone interview.

## 2019-09-02 NOTE — H&P (Signed)
HPI: Aaron [name] is a 69 year-old male with a bladder lesion.  He first noticed the symptoms 06/13/2019. He did see the blood in his urine. He has seen blood clots.   He does have a burning sensation when he urinates. He is not currently having trouble urinating.   He has not had kidney stones. He is not having pain. He has not recently had unwanted weight loss.   06/16/19: He is seen today for further evaluation of gross hematuria that occurred on 06/13/19. It occurred for several hours and then cleared. It was associated with urgency but no flank pain, dysuria or other voiding complaints. He has no history of stones. He has smoked in the distant past but has not smoked for 37 years. He was seen at an urgent care the day that his hematuria occurred and a urinalysis was checked but no other studies were performed.  He has no baseline voiding symptoms. He smoked about 1.5 packs of cigarettes a day for 10 years when he did smoke. He has not had any exposure to industrial chemicals and there is no family history of GU malignancy or renal disease.   07/01/19: He returns today for completion of his workup of gross hematuria and has not experienced any further hematuria. He has undergone upper tract evaluation with a CT scan.   08/26/19: He returns after having undergone evaluation for gross hematuria with CT scan revealing no abnormality upper tract but cystoscopically I noted some reddened areas on the posterior wall the bladder without any papillary lesions. They did not appear to be CIS but having experienced gross hematuria and with an abnormality noted on cystoscopy I have recommended that he return for repeat cystoscopy.     ALLERGIES: NKDA    MEDICATIONS: Simvastatin 20 mg tablet  INSULIN NPH  Invokamet  Lisinopril-Hydrochlorothiazide 20 mg-25 mg tablet  Pregabalin 50 mg capsule     GU PSH: Cystoscopy - 07/01/2019 Locm 300-399Mg /Ml Iodine,1Ml - 06/26/2019     NON-GU PSH: Back  Surgery (Unspecified)     GU PMH: Benign Neo Rt adrenal gland, Right, We did discuss the incidental finding of his adrenal adenoma. - 07/01/2019 Bladder, Neoplasm of Unspecified behavior, He had multiple 3 mm reddened areas directly on the posterior wall the bladder in the midline with no other areas noted. He is not a long distance runner nor does he participated in activities that would cause any form of trauma to the bladder wall but these did not appear to be worrisome lesions like CIS however I have recommended that urine be sent for cytology and then have him return in 8 weeks for repeat cystoscopy to be sure these have cleared as they appear most likely inflammatory. - 07/01/2019 Gross hematuria, He has not had any further hematuria. - 07/01/2019, His only risk factor for urothelial carcinoma is his past history of cigarette smoking. He will undergo a full hematuria workup., - 06/16/2019 Encounter for Prostate Cancer screening, His prostate was noted to be smooth and benign. It does not appear he has had a PSA done recently and because he has had hematuria this will be obtained today. - 06/16/2019 Hydrocele, A incidentally noted a small right hydrocele on his exam. It has been like this for some time and is asymptomatic. I did recommend any treatment and he was in agreement with this. - 06/16/2019    NON-GU PMH: Diabetes Type 2 Hypercholesterolemia Hypertension    FAMILY HISTORY: 2 daughters - Daughter Diabetes - Runs in  Family   SOCIAL HISTORY: Marital Status: Married Preferred Language: English; Ethnicity: Not Hispanic Or Latino; Race: White Current Smoking Status: Patient does not smoke anymore.   Tobacco Use Assessment Completed: Used Tobacco in last 30 days? Has never drank.  Drinks 2 caffeinated drinks per day.    REVIEW OF SYSTEMS:    GU Review Male:   Patient denies frequent urination, hard to postpone urination, penile pain, erection problems, leakage of urine, get up at  night to urinate, have to strain to urinate , trouble starting your stream, stream starts and stops, and burning/ pain with urination.  Gastrointestinal (Upper):   Patient denies nausea, vomiting, and indigestion/ heartburn.  Gastrointestinal (Lower):   Patient denies diarrhea and constipation.  Constitutional:   Patient denies fever, night sweats, weight loss, and fatigue.  Skin:   Patient denies skin rash/ lesion and itching.  Eyes:   Patient denies blurred vision and double vision.  Ears/ Nose/ Throat:   Patient denies sore throat and sinus problems.  Hematologic/Lymphatic:   Patient denies swollen glands and easy bruising.  Cardiovascular:   Patient denies leg swelling and chest pains.  Respiratory:   Patient denies cough and shortness of breath.  Endocrine:   Patient denies excessive thirst.  Musculoskeletal:   Patient denies back pain and joint pain.  Neurological:   Patient denies headaches and dizziness.  Psychologic:   Patient denies depression and anxiety.   VITAL SIGNS:     Weight 204 lb / 92.53 kg  Height 75 in / 190.5 cm  BP 140/67 mmHg  Pulse 87 /min  Temperature 96.9 F / 36.0 C  BMI 25.5 kg/m   GU PHYSICAL EXAMINATION:    Anus and Perineum: No hemorrhoids. No anal stenosis. No rectal fissure, no anal fissure. No edema, no dimple, no perineal tenderness, no anal tenderness.  Scrotum: No lesions. No edema. No cysts. No warts.  Epididymides: Right: no spermatocele, no masses, no cysts, no tenderness, no induration, no enlargement. Left: no spermatocele, no masses, no cysts, no tenderness, no induration, no enlargement.  Testes: < 3 cm hydrocele right testis. No tenderness, no swelling, no enlargement left testis. No tenderness, no swelling, no enlargement right testis. Normal location left testis. Normal location right testis. No mass, no cyst, no varicocele, no hydrocele left testis. No mass, no cyst, no varicocele right testis.   Urethral Meatus: Normal size. No lesion, no  wart, no discharge, no polyp. Normal location.  Penis: Circumcised, no warts, no cracks. No dorsal Peyronie's plaques, no left corporal Peyronie's plaques, no right corporal Peyronie's plaques, no scarring, no warts. No balanitis, no meatal stenosis.  Prostate: 40 gram or 2+ size. Left lobe normal consistency, right lobe normal consistency. Symmetrical lobes. No prostate nodule. Left lobe no tenderness, right lobe no tenderness.  Seminal Vesicles: Nonpalpable.  Sphincter Tone: Normal sphincter. No rectal tenderness. No rectal mass.    MULTI-SYSTEM PHYSICAL EXAMINATION:    Constitutional: Well-nourished. No physical deformities. Normally developed. Good grooming.  Neck: Neck symmetrical, not swollen. Normal tracheal position.  Respiratory: No labored breathing, no use of accessory muscles.   Cardiovascular: Normal temperature, normal extremity pulses, no swelling, no varicosities.  Lymphatic: No enlargement of neck, axillae, groin.  Skin: No paleness, no jaundice, no cyanosis. No lesion, no ulcer, no rash.  Neurologic / Psychiatric: Oriented to time, oriented to place, oriented to person. No depression, no anxiety, no agitation.  Gastrointestinal: No mass, no tenderness, no rigidity, non obese abdomen.  Eyes: Normal conjunctivae. Normal eyelids.  Ears, Nose, Mouth, and Throat: Left ear no scars, no lesions, no masses. Right ear no scars, no lesions, no masses. Nose no scars, no lesions, no masses. Normal hearing. Normal lips.  Musculoskeletal: Normal gait and station of head and neck.    PAST DATA REVIEWED:  Source Of History:  Patient  Lab Test Review:   Urine Cytology  Records Review:   Previous Patient Records   06/16/19  PSA  Total PSA 0.79 ng/mL   Notes:                     His urine cytology at his last visit showed some atypical cells but no high-grade urothelial malignant cells were identified.   PROCEDURES:         Flexible Cystoscopy on 08/26/2019 Risks, benefits, and some of  the potential complications were discussed. Sterile technique and 2% Lidocaine intraurethral analgesia were used.  Meatus:  Normal size. Normal location. Normal condition.  Urethra:  No strictures.  External Sphincter:  Normal.  Verumontanum:  Normal.  Prostate:  Non-obstructing. No hyperplasia.  Bladder Neck:  Non-obstructing.  Ureteral Orifices:  Normal location. Normal size. Normal shape. Effluxed clear urine.  Bladder:  Mild trabeculation. I again found some red areas on the posterior superior wall the bladder at the junction between the dome and the posterior wall. There are isolated the that location. They are not papillary.      The lower urinary tract was carefully examined. The procedure was well-tolerated and without complications. Instructions were given to call the office immediately for bloody urine, difficulty urinating, urinary retention, painful or frequent urination, fever or other illness. The patient stated that he understood these instructions and would comply with them.         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 I need to perform cystoscopy and bladder biopsy to rule out CIS. I have discussed the procedure with him in detail including its risks and complications. The probability of success, the outpatient nature of the procedure as well as the anticipated postoperative course. He understands and has elected to proceed.

## 2019-09-04 ENCOUNTER — Other Ambulatory Visit (HOSPITAL_COMMUNITY): Payer: Medicare Other

## 2019-09-05 ENCOUNTER — Other Ambulatory Visit (HOSPITAL_COMMUNITY)
Admission: RE | Admit: 2019-09-05 | Discharge: 2019-09-05 | Disposition: A | Discharge status: Home / Self Care | Payer: Medicare Other | Source: Ambulatory Visit | Attending: Urology | Admitting: Urology

## 2019-09-05 DIAGNOSIS — Z20822 Contact with and (suspected) exposure to covid-19: Secondary | ICD-10-CM | POA: Insufficient documentation

## 2019-09-05 DIAGNOSIS — Z01812 Encounter for preprocedural laboratory examination: Secondary | ICD-10-CM | POA: Diagnosis not present

## 2019-09-05 LAB — SARS CORONAVIRUS 2 (TAT 6-24 HRS): SARS Coronavirus 2: NEGATIVE

## 2019-09-07 ENCOUNTER — Ambulatory Visit: Payer: Medicare Other

## 2019-09-08 ENCOUNTER — Encounter (HOSPITAL_BASED_OUTPATIENT_CLINIC_OR_DEPARTMENT_OTHER)
Admission: RE | Disposition: A | Discharge status: Home / Self Care | Payer: Self-pay | Source: Home / Self Care | Attending: Urology

## 2019-09-08 ENCOUNTER — Ambulatory Visit (HOSPITAL_BASED_OUTPATIENT_CLINIC_OR_DEPARTMENT_OTHER)
Admission: RE | Admit: 2019-09-08 | Discharge: 2019-09-08 | Disposition: A | Discharge status: Home / Self Care | Payer: Medicare Other | Attending: Urology | Admitting: Urology

## 2019-09-08 ENCOUNTER — Encounter (HOSPITAL_BASED_OUTPATIENT_CLINIC_OR_DEPARTMENT_OTHER): Payer: Self-pay | Admitting: Urology

## 2019-09-08 ENCOUNTER — Ambulatory Visit (HOSPITAL_BASED_OUTPATIENT_CLINIC_OR_DEPARTMENT_OTHER): Payer: Medicare Other | Admitting: Anesthesiology

## 2019-09-08 ENCOUNTER — Other Ambulatory Visit: Payer: Self-pay

## 2019-09-08 DIAGNOSIS — Z794 Long term (current) use of insulin: Secondary | ICD-10-CM | POA: Insufficient documentation

## 2019-09-08 DIAGNOSIS — N329 Bladder disorder, unspecified: Secondary | ICD-10-CM | POA: Diagnosis not present

## 2019-09-08 DIAGNOSIS — N3289 Other specified disorders of bladder: Secondary | ICD-10-CM | POA: Diagnosis not present

## 2019-09-08 DIAGNOSIS — I10 Essential (primary) hypertension: Secondary | ICD-10-CM | POA: Insufficient documentation

## 2019-09-08 DIAGNOSIS — E7800 Pure hypercholesterolemia, unspecified: Secondary | ICD-10-CM | POA: Insufficient documentation

## 2019-09-08 DIAGNOSIS — D494 Neoplasm of unspecified behavior of bladder: Secondary | ICD-10-CM | POA: Diagnosis not present

## 2019-09-08 DIAGNOSIS — Z87891 Personal history of nicotine dependence: Secondary | ICD-10-CM | POA: Diagnosis not present

## 2019-09-08 DIAGNOSIS — R310 Gross hematuria: Secondary | ICD-10-CM | POA: Diagnosis not present

## 2019-09-08 DIAGNOSIS — E119 Type 2 diabetes mellitus without complications: Secondary | ICD-10-CM | POA: Insufficient documentation

## 2019-09-08 DIAGNOSIS — Z79899 Other long term (current) drug therapy: Secondary | ICD-10-CM | POA: Insufficient documentation

## 2019-09-08 DIAGNOSIS — Z833 Family history of diabetes mellitus: Secondary | ICD-10-CM | POA: Diagnosis not present

## 2019-09-08 HISTORY — PX: CYSTOSCOPY WITH BIOPSY: SHX5122

## 2019-09-08 HISTORY — DX: Type 2 diabetes mellitus without complications: E11.9

## 2019-09-08 LAB — GLUCOSE, CAPILLARY: Glucose-Capillary: 165 mg/dL — ABNORMAL HIGH (ref 70–99)

## 2019-09-08 LAB — POCT I-STAT, CHEM 8
BUN: 17 mg/dL (ref 8–23)
Calcium, Ion: 1.32 mmol/L (ref 1.15–1.40)
Chloride: 102 mmol/L (ref 98–111)
Creatinine, Ser: 0.9 mg/dL (ref 0.61–1.24)
Glucose, Bld: 158 mg/dL — ABNORMAL HIGH (ref 70–99)
HCT: 44 % (ref 39.0–52.0)
Hemoglobin: 15 g/dL (ref 13.0–17.0)
Potassium: 4.1 mmol/L (ref 3.5–5.1)
Sodium: 137 mmol/L (ref 135–145)
TCO2: 24 mmol/L (ref 22–32)

## 2019-09-08 SURGERY — CYSTOSCOPY, WITH BIOPSY
Anesthesia: General | Site: Bladder

## 2019-09-08 MED ORDER — ACETAMINOPHEN 500 MG PO TABS
1000.0000 mg | ORAL_TABLET | Freq: Once | ORAL | Status: AC
  Administered 2019-09-08: 1000 mg via ORAL
  Filled 2019-09-08: qty 2

## 2019-09-08 MED ORDER — STERILE WATER FOR IRRIGATION IR SOLN
Status: DC | PRN
  Administered 2019-09-08: 3000 mL

## 2019-09-08 MED ORDER — ACETAMINOPHEN 500 MG PO TABS
ORAL_TABLET | ORAL | Status: AC
  Filled 2019-09-08: qty 2

## 2019-09-08 MED ORDER — KETOROLAC TROMETHAMINE 30 MG/ML IJ SOLN
INTRAMUSCULAR | Status: AC
  Filled 2019-09-08: qty 1

## 2019-09-08 MED ORDER — LACTATED RINGERS IV SOLN
INTRAVENOUS | Status: DC
  Administered 2019-09-08: 1000 mL via INTRAVENOUS
  Filled 2019-09-08: qty 1000

## 2019-09-08 MED ORDER — FENTANYL CITRATE (PF) 100 MCG/2ML IJ SOLN
INTRAMUSCULAR | Status: AC
  Filled 2019-09-08: qty 2

## 2019-09-08 MED ORDER — ONDANSETRON HCL 4 MG/2ML IJ SOLN
INTRAMUSCULAR | Status: DC | PRN
  Administered 2019-09-08: 4 mg via INTRAVENOUS

## 2019-09-08 MED ORDER — PHENYLEPHRINE 40 MCG/ML (10ML) SYRINGE FOR IV PUSH (FOR BLOOD PRESSURE SUPPORT)
PREFILLED_SYRINGE | INTRAVENOUS | Status: DC | PRN
  Administered 2019-09-08 (×2): 80 ug via INTRAVENOUS

## 2019-09-08 MED ORDER — LIDOCAINE 2% (20 MG/ML) 5 ML SYRINGE
INTRAMUSCULAR | Status: DC | PRN
  Administered 2019-09-08: 80 mg via INTRAVENOUS

## 2019-09-08 MED ORDER — KETOROLAC TROMETHAMINE 30 MG/ML IJ SOLN
INTRAMUSCULAR | Status: DC | PRN
  Administered 2019-09-08: 15 mg via INTRAVENOUS

## 2019-09-08 MED ORDER — PHENAZOPYRIDINE HCL 200 MG PO TABS: 200.0000 mg | ORAL_TABLET | Freq: Three times a day (TID) | ORAL | 0 refills | Status: AC | PRN

## 2019-09-08 MED ORDER — DEXAMETHASONE SODIUM PHOSPHATE 10 MG/ML IJ SOLN
INTRAMUSCULAR | Status: DC | PRN
  Administered 2019-09-08: 5 mg via INTRAVENOUS

## 2019-09-08 MED ORDER — FENTANYL CITRATE (PF) 100 MCG/2ML IJ SOLN
INTRAMUSCULAR | Status: DC | PRN
  Administered 2019-09-08: 50 ug via INTRAVENOUS

## 2019-09-08 MED ORDER — CIPROFLOXACIN IN D5W 400 MG/200ML IV SOLN
INTRAVENOUS | Status: AC
  Filled 2019-09-08: qty 200

## 2019-09-08 MED ORDER — LIDOCAINE 2% (20 MG/ML) 5 ML SYRINGE
INTRAMUSCULAR | Status: AC
  Filled 2019-09-08: qty 5

## 2019-09-08 MED ORDER — PROPOFOL 10 MG/ML IV BOLUS
INTRAVENOUS | Status: AC
  Filled 2019-09-08: qty 20

## 2019-09-08 MED ORDER — ONDANSETRON HCL 4 MG/2ML IJ SOLN
INTRAMUSCULAR | Status: AC
  Filled 2019-09-08: qty 2

## 2019-09-08 MED ORDER — DEXAMETHASONE SODIUM PHOSPHATE 10 MG/ML IJ SOLN
INTRAMUSCULAR | Status: AC
  Filled 2019-09-08: qty 1

## 2019-09-08 MED ORDER — PROPOFOL 10 MG/ML IV BOLUS
INTRAVENOUS | Status: DC | PRN
  Administered 2019-09-08: 150 mg via INTRAVENOUS
  Administered 2019-09-08: 30 mg via INTRAVENOUS

## 2019-09-08 MED ORDER — FENTANYL CITRATE (PF) 100 MCG/2ML IJ SOLN
25.0000 ug | INTRAMUSCULAR | Status: DC | PRN
  Filled 2019-09-08: qty 1

## 2019-09-08 MED ORDER — CIPROFLOXACIN IN D5W 400 MG/200ML IV SOLN
400.0000 mg | Freq: Once | INTRAVENOUS | Status: AC
  Administered 2019-09-08: 12:00:00 400 mg via INTRAVENOUS
  Filled 2019-09-08: qty 200

## 2019-09-08 SURGICAL SUPPLY — 21 items
BAG DRAIN URO-CYSTO SKYTR STRL (DRAIN) ×2 IMPLANT
CLOTH BEACON ORANGE TIMEOUT ST (SAFETY) ×2 IMPLANT
ELECT REM PT RETURN 9FT ADLT (ELECTROSURGICAL) ×2
ELECTRODE REM PT RTRN 9FT ADLT (ELECTROSURGICAL) ×1 IMPLANT
GLOVE BIO SURGEON STRL SZ8 (GLOVE) ×2 IMPLANT
GOWN STRL REUS W/ TWL LRG LVL3 (GOWN DISPOSABLE) ×1 IMPLANT
GOWN STRL REUS W/ TWL XL LVL3 (GOWN DISPOSABLE) ×1 IMPLANT
GOWN STRL REUS W/TWL LRG LVL3 (GOWN DISPOSABLE) ×1
GOWN STRL REUS W/TWL XL LVL3 (GOWN DISPOSABLE) ×1
IV NS IRRIG 3000ML ARTHROMATIC (IV SOLUTION) ×2 IMPLANT
KIT TURNOVER CYSTO (KITS) ×2 IMPLANT
LOOP CUT BIPOLAR 24F LRG (ELECTROSURGICAL) ×2 IMPLANT
MANIFOLD NEPTUNE II (INSTRUMENTS) ×2 IMPLANT
NEEDLE HYPO 22GX1.5 SAFETY (NEEDLE) IMPLANT
PACK CYSTO (CUSTOM PROCEDURE TRAY) ×2 IMPLANT
SYR TOOMEY IRRIG 70ML (MISCELLANEOUS) ×2
SYRINGE TOOMEY IRRIG 70ML (MISCELLANEOUS) ×1 IMPLANT
TUBE CONNECTING 12X1/4 (SUCTIONS) ×2 IMPLANT
TUBING UROLOGY SET (TUBING) ×2 IMPLANT
WATER STERILE IRR 3000ML UROMA (IV SOLUTION) ×2 IMPLANT
WATER STERILE IRR 500ML POUR (IV SOLUTION) ×2 IMPLANT

## 2019-09-08 NOTE — Transfer of Care (Signed)
Immediate Anesthesia Transfer of Care Note  Patient: Aaron [name]  Procedure(s) Performed: CYSTOSCOPY WITH BLADDER BIOPSY (N/A Bladder)  Patient Location: PACU  Anesthesia Type:General  Level of Consciousness: drowsy and patient cooperative  Airway & Oxygen Therapy: Patient Spontanous Breathing and Patient connected to nasal cannula oxygen  Post-op Assessment: Report given to RN and Post -op Vital signs reviewed and stable  Post vital signs: Reviewed and stable  Last Vitals:  Vitals Value Taken Time  BP 173/81 09/08/19 1227  Temp    Pulse 75 09/08/19 1230  Resp 12 09/08/19 1230  SpO2 100 % 09/08/19 1230  Vitals shown include unvalidated device data.  Last Pain:  Vitals:   09/08/19 1029  TempSrc: Oral         Complications: No apparent anesthesia complications

## 2019-09-08 NOTE — Discharge Instructions (Signed)
Post Bladder Surgery Instructions   General instructions:     Your recent bladder surgery requires very little post hospital care but some definite precautions.  Despite the fact that no skin incisions were used, the area around the bladder incisions are raw and covered with scabs to promote healing and prevent bleeding. Certain precautions are needed to insure that the scabs are not disturbed over the next 2-4 weeks while the healing proceeds.  Because the raw surface inside your bladder and the irritating effects of urine you may expect frequency of urination and/or urgency (a stronger desire to urinate) and perhaps even getting up at night more often. This will usually resolve or improve slowly over the healing period. You may see some blood in your urine over the first 6 weeks. Do not be alarmed, even if the urine was clear for a while. Get off your feet and drink lots of fluids until clearing occurs. If you start to pass clots or don't improve call Aaron [name].  Catheter: (If you are discharged with a catheter.)  1. Keep your catheter secured to your leg at all times with tape or the supplied strap. 2. You may experience leakage of urine around your catheter- as long as the  catheter continues to drain, this is normal.  If your catheter stops draining  go to the ER. 3. You may also have blood in your urine, even after it has been clear for  several days; you may even pass some small blood clots or other material.  This  is normal as well.  If this happens, sit down and drink plenty of water to help  make urine to flush out your bladder.  If the blood in your urine becomes worse  after doing this, contact our office or return to the ER. 4. You may use the leg bag (small bag) during the day, but use the large bag at  night.  Diet:  You may return to your normal diet immediately. Because of the raw surface of your bladder, alcohol, spicy foods, foods high in acid and drinks with caffeine may  cause irritation or frequency and should be used in moderation. To keep your urine flowing freely and avoid constipation, drink plenty of fluids during the day (8-10 glasses). Tip: Avoid cranberry juice because it is very acidic.  Activity:  Your physical activity doesn't need to be restricted. However, if you are very active, you may see some blood in the urine. We suggest that you reduce your activity under the circumstances until the bleeding has stopped.  Bowels:  It is important to keep your bowels regular during the postoperative period. Straining with bowel movements can cause bleeding. A bowel movement every other day is reasonable. Use a mild laxative if needed, such as milk of magnesia 2-3 tablespoons, or 2 Dulcolax tablets. Call if you continue to have problems. If you had been taking narcotics for pain, before, during or after your surgery, you may be constipated. Take a laxative if necessary.    Medication:  You should resume your pre-surgery medications unless told not to. In addition you may be given an antibiotic to prevent or treat infection. Antibiotics are not always necessary. All medication should be taken as prescribed until the bottles are finished unless you are having an unusual reaction to one of the drugs.    Post Anesthesia Home Care Instructions  Activity: Get plenty of rest for the remainder of the day. A responsible individual must stay with you  for 24 hours following the procedure.  For the next 24 hours, DO NOT: -Drive a car -Paediatric nurse -Drink alcoholic beverages -Take any medication unless instructed by your physician -Make any legal decisions or sign important papers.  Meals: Start with liquid foods such as gelatin or soup. Progress to regular foods as tolerated. Avoid greasy, spicy, heavy foods. If nausea and/or vomiting occur, drink only clear liquids until the nausea and/or vomiting subsides. Call your physician if vomiting  continues.  Special Instructions/Symptoms: Your throat may feel dry or sore from the anesthesia or the breathing tube placed in your throat during surgery. If this causes discomfort, gargle with warm salt water. The discomfort should disappear within 24 hours.  If you had a scopolamine patch placed behind your ear for the management of post- operative nausea and/or vomiting:  1. The medication in the patch is effective for 72 hours, after which it should be removed.  Wrap patch in a tissue and discard in the trash. Wash hands thoroughly with soap and water. 2. You may remove the patch earlier than 72 hours if you experience unpleasant side effects which may include dry mouth, dizziness or visual disturbances. 3. Avoid touching the patch. Wash your hands with soap and water after contact with the patch.  Do not take any Tylenol until after 5:00 pm today Do not take any nonsteroidal anti inflammatories  Until after 6:15 pm today.

## 2019-09-08 NOTE — Anesthesia Procedure Notes (Signed)
Procedure Name: LMA Insertion Date/Time: 09/08/2019 11:57 AM Performed by: Gwyndolyn Saxon, CRNA Pre-anesthesia Checklist: Emergency Drugs available, Patient identified, Suction available and Patient being monitored Patient Re-evaluated:Patient Re-evaluated prior to induction Oxygen Delivery Method: Circle system utilized Preoxygenation: Pre-oxygenation with 100% oxygen Induction Type: IV induction Ventilation: Mask ventilation without difficulty LMA: LMA inserted LMA Size: 4.0 Number of attempts: 1 Placement Confirmation: positive ETCO2 and breath sounds checked- equal and bilateral Tube secured with: Tape Dental Injury: Teeth and Oropharynx as per pre-operative assessment

## 2019-09-08 NOTE — Op Note (Signed)
PATIENT:  Aaron [name]  PRE-OPERATIVE DIAGNOSIS: 1.  History of gross hematuria. 2.  Bladder lesions  POST-OPERATIVE DIAGNOSIS: Same  PROCEDURE: 1.  Cystoscopy with bladder biopsy and barbotage  SURGEON:  Claybon Jabs  INDICATION: Aaron [name] is a 69 year old male who experienced gross hematuria.  He was evaluated with a CT scan which revealed no abnormality of the upper tract.  Cystoscopically I found no lesions in the bladder that appeared papillary but did find 2 areas of just mild erythema on the posterior/dome.  They did not seem to be significant enough to have caused his gross hematuria and appeared very minor so what I recommended is reevaluation in 4 weeks with repeat cystoscopy however the lesions persisted and therefore he is brought to the operating room for biopsy.  ANESTHESIA:  General  EBL:  Minimal  DRAINS: None  LOCAL MEDICATIONS USED:  None  SPECIMEN: 1.  Barbotage specimen for cytology. 2.  Cold cup biopsies of bladder lesions.  Description of procedure: After informed consent the patient was taken to the operating room and placed on the table in a supine position. General anesthesia was then administered. Once fully anesthetized the patient was moved to the dorsal lithotomy position and the genitalia were sterilely prepped and draped in standard fashion. An official timeout was then performed.  I initially passed the 74 French rigid cystoscope with 30 degree lens down the urethra and noted some slight hold-up in the area of the bulbar urethra that did not prevent passage of the flexible cystoscope previously.  Was able to negotiate beyond this with minimal pressure and noted the prostate had no lesions and was nonobstructing.  The bladder was then entered and fully inspected.  I noted 1+ trabeculation.  The right and left ureteral orifice were noted to be of normal configuration and position.  The areas previously noted were again identified with both the 30  and 70 degree lens and photographed.  They were located near the dome of the bladder and had no papillary component.  Using normal saline I performed barbotage of the bladder and sent this for cytology.  I then used the Nonah Mattes to obtain biopsies of the 2 areas.  3 total biopsies were obtained.  I then used the same device to fulgurate the location of the biopsies and then also the area of mucosa surrounding the site so that all of the abnormal appearing mucosa had been completely fulgurated.  There was no bleeding.  I therefore drained the bladder, the cystoscope was removed and the patient was awakened and taken to the recovery room in stable and satisfactory condition.  He tolerated the procedure well with no intraoperative complications.  PLAN OF CARE: Discharge to home after PACU  PATIENT DISPOSITION:  PACU - hemodynamically stable.

## 2019-09-08 NOTE — Anesthesia Preprocedure Evaluation (Addendum)
Anesthesia Evaluation  Patient identified by MRN, date of birth, ID band Patient awake    Reviewed: Allergy & Precautions, NPO status , Patient's Chart, lab work & pertinent test results  Airway Mallampati: II  TM Distance: >3 FB Neck ROM: Full    Dental  (+) Edentulous Upper, Edentulous Lower   Pulmonary neg pulmonary ROS, former smoker,    Pulmonary exam normal breath sounds clear to auscultation       Cardiovascular hypertension, Pt. on medications negative cardio ROS Normal cardiovascular exam Rhythm:Regular Rate:Normal     Neuro/Psych negative neurological ROS  negative psych ROS   GI/Hepatic negative GI ROS, Neg liver ROS,   Endo/Other  negative endocrine ROSdiabetes, Insulin Dependent  Renal/GU negative Renal ROS  negative genitourinary   Musculoskeletal negative musculoskeletal ROS (+)   Abdominal   Peds  Hematology negative hematology ROS (+)   Anesthesia Other Findings   Reproductive/Obstetrics                            Anesthesia Physical Anesthesia Plan  ASA: II  Anesthesia Plan: General   Post-op Pain Management:    Induction: Intravenous  PONV Risk Score and Plan: 2 and Ondansetron, Dexamethasone and Midazolam  Airway Management Planned: LMA  Additional Equipment:   Intra-op Plan:   Post-operative Plan: Extubation in OR  Informed Consent: I have reviewed the patients History and Physical, chart, labs and discussed the procedure including the risks, benefits and alternatives for the proposed anesthesia with the patient or authorized representative who has indicated his/her understanding and acceptance.     Dental advisory given  Plan Discussed with: CRNA  Anesthesia Plan Comments:         Anesthesia Quick Evaluation

## 2019-09-09 LAB — CYTOLOGY - NON PAP

## 2019-09-09 LAB — SURGICAL PATHOLOGY

## 2019-09-09 NOTE — Anesthesia Postprocedure Evaluation (Signed)
Anesthesia Post Note  Patient: Aaron [name]  Procedure(s) Performed: CYSTOSCOPY WITH BLADDER BIOPSY (N/A Bladder)     Patient location during evaluation: PACU Anesthesia Type: General Level of consciousness: awake and alert Pain management: pain level controlled Vital Signs Assessment: post-procedure vital signs reviewed and stable Respiratory status: spontaneous breathing, nonlabored ventilation, respiratory function stable and patient connected to nasal cannula oxygen Cardiovascular status: blood pressure returned to baseline and stable Postop Assessment: no apparent nausea or vomiting Anesthetic complications: no    Last Vitals:  Vitals:   09/08/19 1307 09/08/19 1345  BP: (!) 187/75 (!) 186/72  Pulse: 67 67  Resp: 12 14  Temp:    SpO2: 100% 100%    Last Pain:  Vitals:   09/09/19 0953  TempSrc:   PainSc: 3    Pain Goal:                   [name]

## 2019-09-19 ENCOUNTER — Ambulatory Visit: Payer: Medicare Other | Attending: Internal Medicine

## 2019-09-19 DIAGNOSIS — Z23 Encounter for immunization: Secondary | ICD-10-CM | POA: Insufficient documentation

## 2019-09-19 NOTE — Progress Notes (Signed)
   Covid-19 Vaccination Clinic  Name:  Aaron [name]    MRN: [id number] DOB: Jul 01, 1951  09/19/2019  Aaron [name] was observed post Covid-19 immunization for 15 minutes without incident. He was provided with Vaccine Information Sheet and instruction to access the V-Safe system.   Aaron [name] was instructed to call 911 with any severe reactions post vaccine: Marland Kitchen Difficulty breathing  . Swelling of face and throat  . A fast heartbeat  . A bad rash all over body  . Dizziness and weakness   Immunizations Administered    Name Date Dose VIS Date Route   Pfizer COVID-19 Vaccine 09/19/2019  1:56 PM 0.3 mL 06/27/2019 Intramuscular   Manufacturer: [place]   Lot: [id number]   [place]: [id number]

## 2019-12-29 DIAGNOSIS — E1165 Type 2 diabetes mellitus with hyperglycemia: Secondary | ICD-10-CM | POA: Diagnosis not present

## 2019-12-29 DIAGNOSIS — E7800 Pure hypercholesterolemia, unspecified: Secondary | ICD-10-CM | POA: Diagnosis not present

## 2020-01-26 ENCOUNTER — Other Ambulatory Visit: Payer: Self-pay

## 2020-01-26 ENCOUNTER — Ambulatory Visit (INDEPENDENT_AMBULATORY_CARE_PROVIDER_SITE_OTHER): Payer: Medicare Other

## 2020-01-26 ENCOUNTER — Ambulatory Visit (INDEPENDENT_AMBULATORY_CARE_PROVIDER_SITE_OTHER): Payer: Medicare Other | Admitting: Podiatry

## 2020-01-26 ENCOUNTER — Encounter: Payer: Self-pay | Admitting: Podiatry

## 2020-01-26 DIAGNOSIS — E11621 Type 2 diabetes mellitus with foot ulcer: Secondary | ICD-10-CM | POA: Insufficient documentation

## 2020-01-26 DIAGNOSIS — L97519 Non-pressure chronic ulcer of other part of right foot with unspecified severity: Secondary | ICD-10-CM | POA: Diagnosis not present

## 2020-01-26 DIAGNOSIS — B351 Tinea unguium: Secondary | ICD-10-CM | POA: Insufficient documentation

## 2020-01-26 DIAGNOSIS — M14671 Charcot's joint, right ankle and foot: Secondary | ICD-10-CM | POA: Diagnosis not present

## 2020-01-26 DIAGNOSIS — E1140 Type 2 diabetes mellitus with diabetic neuropathy, unspecified: Secondary | ICD-10-CM

## 2020-01-26 DIAGNOSIS — M2021 Hallux rigidus, right foot: Secondary | ICD-10-CM | POA: Diagnosis not present

## 2020-01-26 NOTE — Progress Notes (Signed)
Subjective:  Patient ID: Aaron [name], male    DOB: Jun 29, 1951,  MRN: [id number]  Chief Complaint  Patient presents with   Diabetic Ulcer    R plantar forefoot submet 1. x1 month. Pt stated, "It started as a callus - I've had that for a long time. It split open when [place] was [place] the beach. I didn't step on anything. It doesn't hurt - I have neuropathy. It's not infected - it only drains red. I use peroxide and Neosporin".    69 y.o. male presents with the above complaint. History confirmed with patient.  Started as a painful callus and a blister that he 1st noted after being at the beach.  This later split open and developed into a wound.  He thinks may have neuropathy and some sensation issues.  His last A1c was 7.2%, it has been higher before even up to 10.  He is here today with his wife.  This is the 1st ulceration and he has not had care for it thus far.  Feels well denies fevers, chills, nausea or vomiting.  Does not note any odor.  Has noted serosanguineous drainage at home.  Been using peroxide and Neosporin on at home.  Objective:  Physical Exam: warm, good capillary refill, normal DP and PT pulses, reduced sensation at bilateral LE distal to ankle, absent on plantar foot, and ulceration at submet 1 R foot, photo below.  Right Foot: submet 1 full thickness ulceration with heavy hyperkeratotic rim, no evidence of tendon/capsule/bone exposure, mild malodor, SS drainage with no purulence, no cellulitis, measures 1.8cm x 2.0cm x 6cm post debridement     Radiographs: X-ray of the right foot: Severe degenerative changes of the 1st metatarsal phalangeal joint with significant fragmentation and sclerosis and dorsal bony fragments.  Appears to be coalesced Charcot arthropathy.  Lucencies in the proximal phalanx and metatarsal head with cystic changes.  Unclear if degenerative or if this is a bony abscess.  Sesamoids are not well visualized on the lateral view, but on the oblique appears  somewhat sclerotic Assessment:   1. Diabetic ulcer of right foot associated with type 2 diabetes mellitus, unspecified part of foot, unspecified ulcer stage ([place])   2. Neuropathy due to type 2 diabetes mellitus (HCC)   3. Hallux rigidus of right foot   4. Charcot's joint of foot, right      Plan:  Patient was evaluated and treated and all questions answered.  Patient educated on diabetes. Discussed proper diabetic foot care and discussed risks and complications of disease. Educated patient in depth on reasons to return to the office immediately should he/she discover anything concerning or new on the feet. All questions answered. Discussed proper shoes as well.   -He has a full-thickness neuropathic ulceration of the plantar right foot beneath the metatarsal phalangeal joint.  He has degenerative changes on his x-ray which appears to be consistent with severe hallux rigidus versus previous Charcot arthropathy which is coalesced.  He has no motion at the joint and believe this is causing some significant pressure on the plantar foot.  Discussed the etiology, treatment and history and course of neuropathic foot ulcers and the factors affecting.  He has good vascular flow to the foot.  His A1c has improved.  I would like him to improve that beyond to get below 7%.  He does not smoke.  We will offload with a surgical shoe with a peg assist insert.  Home wound care supplies will be ordered from  [facility] to consist of silver alginate and foam border dressing.  On his x-ray there are several cystic areas that appear to either be benign bone cyst versus infectious abscess.  I am ordering an MRI to evaluate this for osteomyelitis versus abscess.  Lab work was also ordered including a CBC and an ESR.  There is currently no exposed tendon or bone visible in the wound but it still been here for some time and has likely been contaminated for some time as well.  He is at high risk for osteomyelitis, severe foot  infection, and limb loss.  We discussed all the above with in detail today with him and his wife.  All questions were answered.  I like to see him back in 2 weeks for reevaluation and MRI and lab review.  Ulcer right foot -Debridement as below. -Dressed with Iodosorb and foam border dressing. -Continue off-loading with surgical shoe.  Procedure: Excisional Debridement of Wound Rationale: Removal of non-viable soft tissue from the wound to promote healing.  Anesthesia: none Pre-Debridement Wound Measurements: 1.8 cm x 2.0 cm x 0.6 cm  Post-Debridement Wound Measurements: 1.8 cm x 2.0 cm x 0.6 cm  Type of Debridement: Sharp Excisional Tissue Removed: Non-viable soft tissue Depth of Debridement: subcutaneous tissue. Technique: Sharp excisional debridement to bleeding, viable wound base.  Dressing: Dry, sterile, compression dressing. Disposition: Patient tolerated procedure well. Patient to return in 1 week for follow-up.     Lanae Crumbly, DPM 01/26/2020      Return in about 2 weeks (around 02/09/2020) for re-check wound R foot, MRI review, lab work review.

## 2020-01-27 ENCOUNTER — Other Ambulatory Visit: Payer: Self-pay | Admitting: Podiatry

## 2020-01-27 ENCOUNTER — Telehealth: Payer: Self-pay | Admitting: *Deleted

## 2020-01-27 DIAGNOSIS — E11621 Type 2 diabetes mellitus with foot ulcer: Secondary | ICD-10-CM

## 2020-01-27 DIAGNOSIS — M2021 Hallux rigidus, right foot: Secondary | ICD-10-CM

## 2020-01-27 DIAGNOSIS — Z01812 Encounter for preprocedural laboratory examination: Secondary | ICD-10-CM

## 2020-01-27 DIAGNOSIS — M14671 Charcot's joint, right ankle and foot: Secondary | ICD-10-CM

## 2020-01-27 DIAGNOSIS — L97519 Non-pressure chronic ulcer of other part of right foot with unspecified severity: Secondary | ICD-10-CM

## 2020-01-27 DIAGNOSIS — E1140 Type 2 diabetes mellitus with diabetic neuropathy, unspecified: Secondary | ICD-10-CM

## 2020-01-27 NOTE — Telephone Encounter (Signed)
-----   Message from Criselda Peaches, DPM sent at 01/26/2020  4:46 PM EDT ----- Regarding: R foot MRI Hi Val,  Can you order a R foot MRI w/ and w/o contrast? Dx is diabetic foot ulcer, bone cyst, and Charcot arthropathy. Attention to the sesamoid bones and 1st metatarsophalangeal joint complex.  Thanks! Lanae Crumbly

## 2020-01-27 NOTE — Telephone Encounter (Signed)
Orders to K. Mavrakis, CMA for pre-cert and faxed to [place].

## 2020-01-27 NOTE — Telephone Encounter (Signed)
Dr. Sherryle Lis ordered calcium alginate dressing and bordered foam dressing for right 1st sub met wound E11.621, L97.519 measuring 1.8 x 2.0 x 0.6cm with low exudate, with gloves, skin prep, and saline from [facility]. Faxed required form, clinicals and demographics to [facility].

## 2020-01-28 LAB — CBC WITH DIFFERENTIAL/PLATELET
Basophils Absolute: 0 10*3/uL (ref 0.0–0.2)
Basos: 0 %
EOS (ABSOLUTE): 0.1 10*3/uL (ref 0.0–0.4)
Eos: 2 %
Hematocrit: 40.6 % (ref 37.5–51.0)
Hemoglobin: 13.8 g/dL (ref 13.0–17.7)
Immature Grans (Abs): 0 10*3/uL (ref 0.0–0.1)
Immature Granulocytes: 0 %
Lymphocytes Absolute: 1.2 10*3/uL (ref 0.7–3.1)
Lymphs: 16 %
MCH: 30 pg (ref 26.6–33.0)
MCHC: 34 g/dL (ref 31.5–35.7)
MCV: 88 fL (ref 79–97)
Monocytes Absolute: 0.7 10*3/uL (ref 0.1–0.9)
Monocytes: 9 %
Neutrophils Absolute: 5.4 10*3/uL (ref 1.4–7.0)
Neutrophils: 73 %
Platelets: 274 10*3/uL (ref 150–450)
RBC: 4.6 x10E6/uL (ref 4.14–5.80)
RDW: 12.5 % (ref 11.6–15.4)
WBC: 7.4 10*3/uL (ref 3.4–10.8)

## 2020-01-28 LAB — SEDIMENTATION RATE: Sed Rate: 25 mm/hr (ref 0–30)

## 2020-01-29 ENCOUNTER — Other Ambulatory Visit: Payer: Self-pay

## 2020-01-29 ENCOUNTER — Ambulatory Visit
Admission: RE | Admit: 2020-01-29 | Discharge: 2020-01-29 | Disposition: A | Discharge status: Home / Self Care | Payer: Medicare Other | Source: Ambulatory Visit | Attending: Podiatry | Admitting: Podiatry

## 2020-01-29 DIAGNOSIS — M2021 Hallux rigidus, right foot: Secondary | ICD-10-CM

## 2020-01-29 DIAGNOSIS — M722 Plantar fascial fibromatosis: Secondary | ICD-10-CM | POA: Diagnosis not present

## 2020-01-29 DIAGNOSIS — L97519 Non-pressure chronic ulcer of other part of right foot with unspecified severity: Secondary | ICD-10-CM | POA: Diagnosis not present

## 2020-01-29 DIAGNOSIS — M14671 Charcot's joint, right ankle and foot: Secondary | ICD-10-CM

## 2020-01-29 DIAGNOSIS — M6289 Other specified disorders of muscle: Secondary | ICD-10-CM | POA: Diagnosis not present

## 2020-01-29 DIAGNOSIS — M19071 Primary osteoarthritis, right ankle and foot: Secondary | ICD-10-CM | POA: Diagnosis not present

## 2020-01-29 DIAGNOSIS — E1140 Type 2 diabetes mellitus with diabetic neuropathy, unspecified: Secondary | ICD-10-CM

## 2020-01-29 DIAGNOSIS — Z01812 Encounter for preprocedural laboratory examination: Secondary | ICD-10-CM

## 2020-01-29 MED ORDER — GADOBENATE DIMEGLUMINE 529 MG/ML IV SOLN
20.0000 mL | Freq: Once | INTRAVENOUS | Status: AC | PRN
  Administered 2020-01-29: 20 mL via INTRAVENOUS

## 2020-01-30 NOTE — Telephone Encounter (Signed)
Valery:  I did not help Dr. Sherryle Lis on Monday. I'm not sure who helped him. I have 3 pre-certs to do for Dr. Cannon Kettle and need the person who helped him on Monday to do this.  Thank you, Rolly Pancake, CMA (AAMA)

## 2020-02-02 ENCOUNTER — Encounter: Payer: Self-pay | Admitting: Podiatry

## 2020-02-05 ENCOUNTER — Ambulatory Visit (INDEPENDENT_AMBULATORY_CARE_PROVIDER_SITE_OTHER): Payer: Medicare Other | Admitting: Podiatry

## 2020-02-05 ENCOUNTER — Other Ambulatory Visit: Payer: Self-pay

## 2020-02-05 ENCOUNTER — Encounter: Payer: Self-pay | Admitting: Podiatry

## 2020-02-05 VITALS — Temp 97.2°F

## 2020-02-05 DIAGNOSIS — L97519 Non-pressure chronic ulcer of other part of right foot with unspecified severity: Secondary | ICD-10-CM

## 2020-02-05 DIAGNOSIS — E1140 Type 2 diabetes mellitus with diabetic neuropathy, unspecified: Secondary | ICD-10-CM | POA: Diagnosis not present

## 2020-02-05 DIAGNOSIS — E11621 Type 2 diabetes mellitus with foot ulcer: Secondary | ICD-10-CM

## 2020-02-05 DIAGNOSIS — M2021 Hallux rigidus, right foot: Secondary | ICD-10-CM

## 2020-02-05 DIAGNOSIS — L97513 Non-pressure chronic ulcer of other part of right foot with necrosis of muscle: Secondary | ICD-10-CM

## 2020-02-05 DIAGNOSIS — M14671 Charcot's joint, right ankle and foot: Secondary | ICD-10-CM

## 2020-02-05 NOTE — Progress Notes (Signed)
Subjective:  Patient ID: Aaron [name], male    DOB: September 16, 1950,  MRN: [id number]  Chief Complaint  Patient presents with  . Wound Check    Follow-up; Right foot; plantar forefoot-submet 1; pt stated, "I still has some drainage; has no pain"  . MRI & Lab Results    See attached results-need to be reviewed; pt Diabetic Type 2; Sugar=did not check today; A1C=pt stated, "7.3"-not in chart    69 y.o. male returns today for reevaluation of his right foot ulceration.  He completed the MRI and lab work.  He is here again today with his wife.  He has been changing the dressings at home with silver alginate and a foam border dressing.  He is ambulating in a surgical shoe with a peg assist device  Objective:  Physical Exam: warm, good capillary refill, normal DP and PT pulses, reduced sensation at bilateral LE distal to ankle, absent on plantar foot, and ulceration at submet 1 R foot  Right Foot: submet 1 full thickness ulceration with hyperkeratotic rim, improved since last visit, no evidence of tendon/capsule/bone exposure, some deep tunneling with fibrotic material in the base, mild malodor, SS drainage with no purulence, no cellulitis, measures 2.0 x 2.0 x 1.0 cm post debridement   Study Result  Narrative & Impression  CLINICAL DATA:  Diabetic foot ulcer with nonhealing blister along the plantar aspect of the 1st MTP joint for 3 weeks.   EXAM: MRI OF THE RIGHT FOREFOOT WITHOUT AND WITH CONTRAST   TECHNIQUE: Multiplanar, multisequence MR imaging of the right forefoot was performed before and after the administration of intravenous contrast.   CONTRAST:  34mL MULTIHANCE GADOBENATE DIMEGLUMINE 529 MG/ML IV SOLN   COMPARISON:  Radiographs 01/26/2020   FINDINGS: Bones/Joint/Cartilage   There is advanced arthropathy at the 1st metatarsophalangeal joint with advanced joint space narrowing, osteophytes and subchondral cyst formation, especially in the base of the proximal  phalanx. There is relatively mild involvement of the sesamoids. No bone destruction, marrow edema or suspicious enhancement. No significant joint effusion.   The additional metatarsophalangeal and interphalangeal joints appear normal. There are mild degenerative changes at the Lisfranc joint.   Ligaments   Intact Lisfranc ligament.   Muscles and Tendons   The forefoot tendons appear intact without significant tenosynovitis. There is mild nodular thickening of the central cord of the plantar fascia distally which may reflect mild plantar fibromatosis or inflammation. 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 No suspicious muscular enhancement.   Soft tissues   There is soft tissue ulceration along the plantar aspect of the medial forefoot at the level of the 1st MTP joint. There are underlying inflammatory changes and heterogeneous enhancement in the underlying subcutaneous fat, but no focal fluid collection or foreign body.   IMPRESSION: 1. Soft tissue ulceration along the plantar aspect of the medial forefoot at the level of the 1st MTP joint with underlying inflammatory changes and heterogeneous enhancement in the underlying subcutaneous fat. No evidence of abscess or foreign body. 2. No evidence of osteomyelitis. 3. 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 4. 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     Electronically Signed   By: Richardean Sale M.D.   On: 01/30/2020 11:33   Sedimentation rate Order: [id number] Status:  Final result   Visible to patient:  Yes (seen) Next appt:  02/19/2020 at 03:45 PM in Podiatry ([place], DPM)  0 Result Notes  Ref Range & Units 9 d ago  Sed  Rate 0 - 30 mm/hr 25   Resulting Agency  [facility]    Narrative Performed by: Maryan Puls Performed at:  [address]  [address] [place], [place]  [id number]  Lab Director: Rush Farmer MD, Phone:  [phone]    Specimen  Collected: 01/27/20 11:06 Last Resulted: 01/28/20 05:36      Lab Flowsheet    Order Details    View Encounter    Lab and Collection Details    Routing    Result History        Result Care Coordination  Patient Communication  Add Comments  Seen Back to Top      Other Results from 01/27/2020  CBC with Differential/Platelet  Status:  Final result   Visible to patient:  Yes (seen) Next appt:  02/19/2020 at 03:45 PM in Podiatry Criselda Peaches, DPM) Order: [id number]  0 Result Notes  Ref Range & Units 9 d ago 5 mo ago  WBC 3.4 - 10.8 x10E3/uL 7.4    RBC 4.14 - 5.80 x10E6/uL 4.60    Hemoglobin 13.0 - 17.7 g/dL 13.8  15.0 R   Hematocrit 37.5 - 51.0 % 40.6  44.0 R   MCV 79 - 97 fL 88    MCH 26.6 - 33.0 pg 30.0    MCHC 31 - 35 g/dL 34.0    RDW 11.6 - 15.4 % 12.5    Platelets 150 - 450 x10E3/uL 274    Neutrophils Not Estab. % 73    Lymphs Not Estab. % 16    Monocytes Not Estab. % 9    Eos Not Estab. % 2    Basos Not Estab. % 0    Neutrophils Absolute 1 - 7 x10E3/uL 5.4    Lymphocytes Absolute 0 - 3 x10E3/uL 1.2    Monocytes Absolute 0 - 0 x10E3/uL 0.7    EOS (ABSOLUTE) 0.0 - 0.4 x10E3/uL 0.1    Basophils Absolute 0 - 0 x10E3/uL 0.0    Immature Granulocytes Not Estab. % 0    Immature Grans (Abs) 0.0 - 0.1 x10E3/uL 0.0    Sodium   137 R   Potassium   4.1 R   Chloride   102 R   BUN   17 R   Creatinine, Ser   0.90 R   Glucose, Bld   158 High  R   Calcium, Ion   1.32 R   TCO2   24 R   Resulting Agency  [facility]    Narrative Performed by: Maryan Puls Performed at:  [address] [employer]  [address] [place], [place]  [id number]  Lab Director: Rush Farmer MD, Phone:  [phone]         Assessment:   1. Ulcer of right foot with necrosis of muscle ([place])   2. Diabetic ulcer of right foot associated with type 2 diabetes mellitus, unspecified part of foot, unspecified ulcer stage ([place])   3. Neuropathy due to type 2 diabetes mellitus ([facility])   4. Hallux  rigidus of right foot   5. Charcot's joint of foot, right      Plan:  Patient was evaluated and treated and all questions answered.  Patient educated on diabetes. Discussed proper diabetic foot care and discussed risks and complications of disease. Educated patient in depth on reasons to return to the office immediately should he/she discover anything concerning or new on the feet. All questions answered. Discussed proper shoes as well.   -MRI and lab work  reviewed patient and his wife.  I discussed with him that he has no clinical, radiographic, or laboratory evidence of osteomyelitis or deeper infection.  We will continue with local wound care with his current home wound care regimen of silver alginate and foam border dressing. -The Charcot remains quiesced sent and we hope to keep this way with a plantigrade foot.  Discussed etiology and treatments for Charcot arthropathy in general with the patient and his wife -We will consider VAC therapy with snap VAC at next visit to improve depth of wound -Wound was debrided as below  Ulcer right foot -Debridement as below. -Dressed with Iodosorb and dry gauze dressing. -Continue off-loading with surgical shoe.  Procedure: Excisional Debridement of Wound Rationale: Removal of non-viable soft tissue from the wound to promote healing.  Anesthesia: none Pre-Debridement Wound Measurements: 2.0 cm x 2.0 cm x 0.6 cm  Post-Debridement Wound Measurements: 2.0 cm x 2.0 cm x 1.0 cm  Type of Debridement: Sharp Excisional Tissue Removed: Non-viable soft tissue, fibrotic plug Depth of Debridement: subcutaneous tissue. Technique: Sharp excisional debridement to bleeding, viable wound base.  Dressing: Dry, sterile, compression dressing. Disposition: Patient tolerated procedure well. Patient to return in 1 week for follow-up.     Lanae Crumbly, DPM 02/05/2020      Return in about 2 weeks (around 02/19/2020) for wound re-check.

## 2020-02-08 ENCOUNTER — Emergency Department (HOSPITAL_COMMUNITY)
Admission: EM | Admit: 2020-02-08 | Discharge: 2020-02-08 | Disposition: A | Discharge status: Home / Self Care | Payer: Medicare Other | Source: Home / Self Care | Attending: Emergency Medicine | Admitting: Emergency Medicine

## 2020-02-08 ENCOUNTER — Emergency Department (HOSPITAL_COMMUNITY): Payer: Medicare Other

## 2020-02-08 ENCOUNTER — Other Ambulatory Visit: Payer: Self-pay

## 2020-02-08 ENCOUNTER — Encounter (HOSPITAL_COMMUNITY): Payer: Self-pay

## 2020-02-08 DIAGNOSIS — Z794 Long term (current) use of insulin: Secondary | ICD-10-CM | POA: Insufficient documentation

## 2020-02-08 DIAGNOSIS — Z87891 Personal history of nicotine dependence: Secondary | ICD-10-CM | POA: Insufficient documentation

## 2020-02-08 DIAGNOSIS — L03115 Cellulitis of right lower limb: Secondary | ICD-10-CM | POA: Diagnosis not present

## 2020-02-08 DIAGNOSIS — L02611 Cutaneous abscess of right foot: Secondary | ICD-10-CM | POA: Diagnosis not present

## 2020-02-08 DIAGNOSIS — I10 Essential (primary) hypertension: Secondary | ICD-10-CM | POA: Insufficient documentation

## 2020-02-08 DIAGNOSIS — E1140 Type 2 diabetes mellitus with diabetic neuropathy, unspecified: Secondary | ICD-10-CM | POA: Insufficient documentation

## 2020-02-08 DIAGNOSIS — M2011 Hallux valgus (acquired), right foot: Secondary | ICD-10-CM | POA: Diagnosis not present

## 2020-02-08 DIAGNOSIS — M7989 Other specified soft tissue disorders: Secondary | ICD-10-CM | POA: Diagnosis not present

## 2020-02-08 DIAGNOSIS — S91301A Unspecified open wound, right foot, initial encounter: Secondary | ICD-10-CM | POA: Diagnosis not present

## 2020-02-08 DIAGNOSIS — Z20822 Contact with and (suspected) exposure to covid-19: Secondary | ICD-10-CM | POA: Diagnosis not present

## 2020-02-08 DIAGNOSIS — E11621 Type 2 diabetes mellitus with foot ulcer: Secondary | ICD-10-CM | POA: Insufficient documentation

## 2020-02-08 DIAGNOSIS — E11610 Type 2 diabetes mellitus with diabetic neuropathic arthropathy: Secondary | ICD-10-CM | POA: Diagnosis not present

## 2020-02-08 DIAGNOSIS — Z79899 Other long term (current) drug therapy: Secondary | ICD-10-CM | POA: Insufficient documentation

## 2020-02-08 DIAGNOSIS — L97519 Non-pressure chronic ulcer of other part of right foot with unspecified severity: Secondary | ICD-10-CM | POA: Insufficient documentation

## 2020-02-08 DIAGNOSIS — M19071 Primary osteoarthritis, right ankle and foot: Secondary | ICD-10-CM | POA: Diagnosis not present

## 2020-02-08 DIAGNOSIS — E1152 Type 2 diabetes mellitus with diabetic peripheral angiopathy with gangrene: Secondary | ICD-10-CM | POA: Diagnosis not present

## 2020-02-08 LAB — CBC WITH DIFFERENTIAL/PLATELET
Abs Immature Granulocytes: 0.1 10*3/uL — ABNORMAL HIGH (ref 0.00–0.07)
Basophils Absolute: 0 10*3/uL (ref 0.0–0.1)
Basophils Relative: 0 %
Eosinophils Absolute: 0 10*3/uL (ref 0.0–0.5)
Eosinophils Relative: 0 %
HCT: 42.3 % (ref 39.0–52.0)
Hemoglobin: 13.7 g/dL (ref 13.0–17.0)
Immature Granulocytes: 1 %
Lymphocytes Relative: 3 %
Lymphs Abs: 0.5 10*3/uL — ABNORMAL LOW (ref 0.7–4.0)
MCH: 29.2 pg (ref 26.0–34.0)
MCHC: 32.4 g/dL (ref 30.0–36.0)
MCV: 90.2 fL (ref 80.0–100.0)
Monocytes Absolute: 0.9 10*3/uL (ref 0.1–1.0)
Monocytes Relative: 6 %
Neutro Abs: 15.3 10*3/uL — ABNORMAL HIGH (ref 1.7–7.7)
Neutrophils Relative %: 90 %
Platelets: 292 10*3/uL (ref 150–400)
RBC: 4.69 MIL/uL (ref 4.22–5.81)
RDW: 13.2 % (ref 11.5–15.5)
WBC: 16.9 10*3/uL — ABNORMAL HIGH (ref 4.0–10.5)
nRBC: 0 % (ref 0.0–0.2)

## 2020-02-08 LAB — BASIC METABOLIC PANEL
Anion gap: 15 (ref 5–15)
BUN: 37 mg/dL — ABNORMAL HIGH (ref 8–23)
CO2: 22 mmol/L (ref 22–32)
Calcium: 9.5 mg/dL (ref 8.9–10.3)
Chloride: 98 mmol/L (ref 98–111)
Creatinine, Ser: 1.06 mg/dL (ref 0.61–1.24)
GFR calc Af Amer: >60 mL/min (ref >60)
GFR calc non Af Amer: >60 mL/min (ref >60)
Glucose, Bld: 326 mg/dL — ABNORMAL HIGH (ref 70–99)
Potassium: 4.8 mmol/L (ref 3.5–5.1)
Sodium: 135 mmol/L (ref 135–145)

## 2020-02-08 LAB — SEDIMENTATION RATE: Sed Rate: 100 mm/hr — ABNORMAL HIGH (ref 0–16)

## 2020-02-08 MED ORDER — AMOXICILLIN-POT CLAVULANATE 875-125 MG PO TABS: 1.0000 | ORAL_TABLET | Freq: Two times a day (BID) | ORAL | 0 refills | Status: DC

## 2020-02-08 MED ORDER — HYDROCODONE-ACETAMINOPHEN 5-325 MG PO TABS: 1.0000 | ORAL_TABLET | ORAL | 0 refills | Status: AC | PRN

## 2020-02-08 MED ORDER — SODIUM CHLORIDE 0.9 % IV BOLUS
500.0000 mL | Freq: Once | INTRAVENOUS | Status: AC
  Administered 2020-02-08: 500 mL via INTRAVENOUS

## 2020-02-08 MED ORDER — SODIUM CHLORIDE 0.9 % IV SOLN
2.0000 g | Freq: Once | INTRAVENOUS | Status: AC
  Administered 2020-02-08: 2 g via INTRAVENOUS
  Filled 2020-02-08: qty 20

## 2020-02-08 NOTE — Discharge Instructions (Addendum)
Keep your appointment with your foot doctor tomorrow. Take antibiotics as prescribed and pain medication as needed.

## 2020-02-08 NOTE — ED Triage Notes (Signed)
Pt presents with c/o foot pain secondary to foot infection on his right foot. Pt reports he is a diabetic and had a ulcer pop up approx 4 weeks ago. Pt has been followed by a doctor at [facility] and was told to come here if the area started to drain and show signs of infection.

## 2020-02-08 NOTE — ED Provider Notes (Signed)
[facility] DEPT Provider Note   CSN: [id number] Arrival date & time: 02/08/20  8469     History Chief Complaint  Patient presents with  . Foot Pain    Aaron [name] is a 69 y.o. male.  HPI He presents for evaluation of foot ulcer.  He saw his podiatrist, 3 days ago at that time had additional treatment with debridement.  He recently had an MRI did not show any acute infections.  Plans were made for podiatric follow-up in 1 week.  Patient decided to come here, because of the swelling in the right foot, which started last night and worsened today.  He also noticed some "chills."  He checked his temperature and it was 100.2.  He denies nausea, vomiting, weakness, shortness of breath, cough or chest pain.  There are no other known modifying factors.    Past Medical History:  Diagnosis Date  . Diabetes mellitus without complication ([place])    type 2  . Hyperlipidemia   . Hypertension     Patient Active Problem List   Diagnosis Date Noted  . Diabetic ulcer of right foot associated with type 2 diabetes mellitus ([place]) 01/26/2020  . Neuropathy due to type 2 diabetes mellitus ([place]) 01/26/2020  . Hallux rigidus of right foot 01/26/2020  . Onychomycosis 01/26/2020    Past Surgical History:  Procedure Laterality Date  . BACK SURGERY  30 yrs ago   lower  . CYSTOSCOPY WITH BIOPSY N/A 09/08/2019   Procedure: CYSTOSCOPY WITH BLADDER BIOPSY;  Surgeon: Kathie Rhodes, MD;  Location: [facility];  Service: Urology;  Laterality: N/A;  ONLY NEEDS 30 MIN       Family History  Problem Relation Age of Onset  . Diabetes Father     Social History   Tobacco Use  . Smoking status: Former Smoker    Packs/day: 2.00    Years: 5.00    Pack years: 10.00    Types: Cigarettes  . Smokeless tobacco: Never Used  . Tobacco comment: quit 39 yrs ago  Vaping Use  . Vaping Use: Never used  Substance Use Topics  . Alcohol use: Not Currently  . Drug  use: Never    Home Medications Prior to Admission medications   Medication Sig Start Date End Date Taking? Authorizing Provider  Canagliflozin-metFORMIN HCl (INVOKAMET) 50-1000 MG TABS Take by mouth at bedtime.    Yes [provider]  insulin NPH-regular Human (70-30) 100 UNIT/ML injection Inject 30-50 Units into the skin See admin instructions. Inject 50 units subcutaneous in the morning and inject 30 to 50 units subcutaneous at night (depending on blood sugar reading).   Yes [provider]  lisinopril-hydrochlorothiazide (ZESTORETIC) 20-25 MG tablet Take 1 tablet by mouth at bedtime.    Yes [provider]  pregabalin (LYRICA) 50 MG capsule Take 50 mg by mouth at bedtime.    Yes [provider]  simvastatin (ZOCOR) 20 MG tablet Take 20 mg by mouth at bedtime.    Yes [provider]  phenazopyridine (PYRIDIUM) 200 MG tablet Take 1 tablet (200 mg total) by mouth 3 (three) times daily as needed for pain. Patient not taking: Reported on 02/08/2020 09/08/19   Kathie Rhodes, MD    Allergies    Patient has no known allergies.  Review of Systems   Review of Systems  All other systems reviewed and are negative.   Physical Exam Updated Vital Signs BP (!) 156/81   Pulse 94  Temp 98.1 F (36.7 C) (Oral)   Resp 16   SpO2 99%   Physical Exam Vitals and nursing note reviewed.  Constitutional:      General: He is not in acute distress.    Appearance: He is well-developed. He is not ill-appearing, toxic-appearing or diaphoretic.  HENT:     Head: Normocephalic and atraumatic.     Right Ear: External ear normal.     Left Ear: External ear normal.     Mouth/Throat:     Mouth: Mucous membranes are moist.  Eyes:     Conjunctiva/sclera: Conjunctivae normal.     Pupils: Pupils are equal, round, and reactive to light.  Neck:     Trachea: Phonation normal.  Cardiovascular:     Rate and Rhythm: Normal rate.  Pulmonary:     Effort: Pulmonary  effort is normal.  Abdominal:     General: There is no distension.  Musculoskeletal:     Cervical back: Normal range of motion and neck supple.     Comments: Right foot with ulceration over the first MTP joint, which appears chronic.  Moderate swelling, with erythema of the dorsal right foot, mostly in the first webspace but extending across the dorsal aspect of the forefoot.  There is no proximal located streaking.  The area in the first webspace is macerated with some superficial skin loss, and an open appearance in the deep web space.  The dressing that had been on the wound, has purulent drainage on it which is malodorous.  Skin:    General: Skin is warm and dry.  Neurological:     Mental Status: He is alert and oriented to person, place, and time.     Cranial Nerves: No cranial nerve deficit.     Sensory: No sensory deficit.     Motor: No abnormal muscle tone.     Coordination: Coordination normal.  Psychiatric:        Mood and Affect: Mood normal.        Behavior: Behavior normal.        Thought Content: Thought content normal.        Judgment: Judgment normal.         ED Results / Procedures / Treatments   Labs (all labs ordered are listed, but only abnormal results are displayed) Labs Reviewed  CBC WITH DIFFERENTIAL/PLATELET - Abnormal; Notable for the following components:      Result Value   WBC 16.9 (*)    Neutro Abs 15.3 (*)    Lymphs Abs 0.5 (*)    Abs Immature Granulocytes 0.10 (*)    All other components within normal limits  BASIC METABOLIC PANEL  SEDIMENTATION RATE    EKG None  Radiology DG Foot 2 Views Right  Result Date: 02/08/2020 CLINICAL DATA:  Diabetic ulceration, foot swelling EXAM: RIGHT FOOT - 2 VIEW COMPARISON:  01/26/2020 FINDINGS: Extensive soft tissue swelling involving the right forefoot has progressed since prior examination. There has also, now all, developed subcutaneous gas between the first and second digits within the interspace no  associated rows of changes within the adjacent osseous structures to suggest osteomyelitis. Well-circumscribed lytic lesion within the proximal phalanx of the great toe may represent a a unicameral bone cyst or phalangeal and chondroma. 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 Mild midfoot degenerative arthritis. Moderate plantar calcaneal spur. Vascular calcifications are noted within the soft tissues. IMPRESSION: Interval development  of plantar wound with extensive soft tissue swelling involving the right forefoot and subcutaneous gas in keeping with aggressive infection. No associated osseous erosive changes. Hallux rigidus Electronically Signed   By: Fidela Salisbury MD   On: 02/08/2020 15:01    Procedures Procedures (including critical care time)  Medications Ordered in ED Medications  cefTRIAXone (ROCEPHIN) 2 g in sodium chloride 0.9 % 100 mL IVPB (has no administration in time range)  sodium chloride 0.9 % bolus 500 mL (500 mLs Intravenous New Bag/Given 02/08/20 1439)    ED Course  I have reviewed the triage vital signs and the nursing notes.  Pertinent labs & imaging results that were available during my care of the patient were reviewed by me and considered in my medical decision making (see chart for details).  Clinical Course as of Feb 08 1511  Sun Feb 08, 2020  1449 After Betadine cleansing, I attempted aspiration from the right first foot webspace to obtain specimen for culturing.  Very small amount of blood was obtained, and no purulence or foreign body material.  No culture sent.   [EW]  1509 Normal except white count high  CBC with Differential(!) [EW]  1510 Per radiologist, swelling present, new, without evidence for osteomyelitis, fracture or foreign body.  DG Foot 2 Views Right [EW]    Clinical Course User Index [EW] Daleen Bo, MD   MDM  Rules/Calculators/A&P                           Patient Vitals for the past 24 hrs:  BP Temp Temp src Pulse Resp SpO2  02/08/20 1334 (!) 156/81 -- -- 94 16 99 %  02/08/20 0946 (!) 158/89 98.1 F (36.7 C) Oral 89 16 99 %     Medical Decision Making:  This patient is presenting for evaluation of foot swelling, with suspected infection, which does require a range of treatment options, and is a complaint that involves a high risk of morbidity and mortality. The differential diagnoses include complication from diabetic foot ulcer, cellulitis, abscess, osteomyelitis. I decided to review old records, and in summary patient with chronic wound right foot being evaluated treated by podiatry, had debridement, several days ago, now presenting with acute swelling over the last 24 hours..  I did not require additional historical information from anyone.  Clinical Laboratory Tests Ordered, included CBC, Metabolic panel and Sed rate. Review indicates elevated white count. Radiologic Tests Ordered, included x-ray right foot to rule out osteomyelitis.  I independently Visualized: Right foot images, which show swelling without fracture or osteomyelitis    Critical Interventions-clinical evaluation, laboratory testing, x-ray imaging, treatment with IV fluids and IV Rocephin.    CRITICAL CARE-no Performed by: Daleen Bo  Nursing Notes Reviewed/ Care Coordinated Applicable Imaging Reviewed Interpretation of Laboratory Data incorporated into ED treatment  Disposition by Dr. Wilson Singer, anticipate home, with Augmentin, after completion of treatment in the emergency department.   Final Clinical Impression(s) / ED Diagnoses Final diagnoses:  Cellulitis of right lower extremity    Rx / DC Orders ED Discharge Orders    None       Daleen Bo, MD 02/09/20 1806

## 2020-02-09 ENCOUNTER — Ambulatory Visit (INDEPENDENT_AMBULATORY_CARE_PROVIDER_SITE_OTHER): Payer: Medicare Other | Admitting: Podiatry

## 2020-02-09 ENCOUNTER — Other Ambulatory Visit: Payer: Self-pay | Admitting: Internal Medicine

## 2020-02-09 ENCOUNTER — Other Ambulatory Visit: Payer: Self-pay

## 2020-02-09 ENCOUNTER — Encounter: Payer: Self-pay | Admitting: Podiatry

## 2020-02-09 ENCOUNTER — Inpatient Hospital Stay (HOSPITAL_COMMUNITY)
Admission: AD | Admit: 2020-02-09 | Discharge: 2020-02-15 | DRG: 041 | Disposition: A | Discharge status: Home Health | Payer: Medicare Other | Source: Ambulatory Visit | Attending: Internal Medicine | Admitting: Internal Medicine

## 2020-02-09 ENCOUNTER — Encounter (HOSPITAL_COMMUNITY): Payer: Self-pay | Admitting: Internal Medicine

## 2020-02-09 DIAGNOSIS — L03115 Cellulitis of right lower limb: Secondary | ICD-10-CM | POA: Diagnosis present

## 2020-02-09 DIAGNOSIS — M71071 Abscess of bursa, right ankle and foot: Secondary | ICD-10-CM | POA: Diagnosis not present

## 2020-02-09 DIAGNOSIS — E1152 Type 2 diabetes mellitus with diabetic peripheral angiopathy with gangrene: Secondary | ICD-10-CM | POA: Diagnosis present

## 2020-02-09 DIAGNOSIS — I10 Essential (primary) hypertension: Secondary | ICD-10-CM | POA: Diagnosis present

## 2020-02-09 DIAGNOSIS — E11628 Type 2 diabetes mellitus with other skin complications: Secondary | ICD-10-CM | POA: Diagnosis present

## 2020-02-09 DIAGNOSIS — D649 Anemia, unspecified: Secondary | ICD-10-CM | POA: Diagnosis present

## 2020-02-09 DIAGNOSIS — E1165 Type 2 diabetes mellitus with hyperglycemia: Secondary | ICD-10-CM | POA: Diagnosis not present

## 2020-02-09 DIAGNOSIS — Z87891 Personal history of nicotine dependence: Secondary | ICD-10-CM

## 2020-02-09 DIAGNOSIS — L02611 Cutaneous abscess of right foot: Secondary | ICD-10-CM | POA: Diagnosis present

## 2020-02-09 DIAGNOSIS — L02619 Cutaneous abscess of unspecified foot: Secondary | ICD-10-CM | POA: Diagnosis present

## 2020-02-09 DIAGNOSIS — Z794 Long term (current) use of insulin: Secondary | ICD-10-CM

## 2020-02-09 DIAGNOSIS — R197 Diarrhea, unspecified: Secondary | ICD-10-CM | POA: Diagnosis present

## 2020-02-09 DIAGNOSIS — E11621 Type 2 diabetes mellitus with foot ulcer: Secondary | ICD-10-CM | POA: Diagnosis present

## 2020-02-09 DIAGNOSIS — M2021 Hallux rigidus, right foot: Secondary | ICD-10-CM | POA: Diagnosis present

## 2020-02-09 DIAGNOSIS — E785 Hyperlipidemia, unspecified: Secondary | ICD-10-CM | POA: Diagnosis present

## 2020-02-09 DIAGNOSIS — Z20822 Contact with and (suspected) exposure to covid-19: Secondary | ICD-10-CM | POA: Diagnosis present

## 2020-02-09 DIAGNOSIS — L03119 Cellulitis of unspecified part of limb: Secondary | ICD-10-CM

## 2020-02-09 DIAGNOSIS — E11610 Type 2 diabetes mellitus with diabetic neuropathic arthropathy: Secondary | ICD-10-CM | POA: Diagnosis present

## 2020-02-09 DIAGNOSIS — Z833 Family history of diabetes mellitus: Secondary | ICD-10-CM | POA: Diagnosis not present

## 2020-02-09 DIAGNOSIS — Z79899 Other long term (current) drug therapy: Secondary | ICD-10-CM

## 2020-02-09 DIAGNOSIS — E876 Hypokalemia: Secondary | ICD-10-CM | POA: Diagnosis not present

## 2020-02-09 DIAGNOSIS — L97519 Non-pressure chronic ulcer of other part of right foot with unspecified severity: Secondary | ICD-10-CM | POA: Diagnosis not present

## 2020-02-09 DIAGNOSIS — L97411 Non-pressure chronic ulcer of right heel and midfoot limited to breakdown of skin: Secondary | ICD-10-CM | POA: Diagnosis not present

## 2020-02-09 DIAGNOSIS — E11618 Type 2 diabetes mellitus with other diabetic arthropathy: Secondary | ICD-10-CM | POA: Diagnosis not present

## 2020-02-09 DIAGNOSIS — L97413 Non-pressure chronic ulcer of right heel and midfoot with necrosis of muscle: Secondary | ICD-10-CM | POA: Diagnosis not present

## 2020-02-09 DIAGNOSIS — M14671 Charcot's joint, right ankle and foot: Secondary | ICD-10-CM

## 2020-02-09 DIAGNOSIS — E1140 Type 2 diabetes mellitus with diabetic neuropathy, unspecified: Secondary | ICD-10-CM | POA: Diagnosis not present

## 2020-02-09 DIAGNOSIS — L97513 Non-pressure chronic ulcer of other part of right foot with necrosis of muscle: Secondary | ICD-10-CM

## 2020-02-09 LAB — CBC WITH DIFFERENTIAL/PLATELET
Abs Immature Granulocytes: 0.08 10*3/uL — ABNORMAL HIGH (ref 0.00–0.07)
Basophils Absolute: 0 10*3/uL (ref 0.0–0.1)
Basophils Relative: 0 %
Eosinophils Absolute: 0.1 10*3/uL (ref 0.0–0.5)
Eosinophils Relative: 1 %
HCT: 38.8 % — ABNORMAL LOW (ref 39.0–52.0)
Hemoglobin: 12.7 g/dL — ABNORMAL LOW (ref 13.0–17.0)
Immature Granulocytes: 1 %
Lymphocytes Relative: 5 %
Lymphs Abs: 0.6 10*3/uL — ABNORMAL LOW (ref 0.7–4.0)
MCH: 29.4 pg (ref 26.0–34.0)
MCHC: 32.7 g/dL (ref 30.0–36.0)
MCV: 89.8 fL (ref 80.0–100.0)
Monocytes Absolute: 0.8 10*3/uL (ref 0.1–1.0)
Monocytes Relative: 7 %
Neutro Abs: 10 10*3/uL — ABNORMAL HIGH (ref 1.7–7.7)
Neutrophils Relative %: 86 %
Platelets: 299 10*3/uL (ref 150–400)
RBC: 4.32 MIL/uL (ref 4.22–5.81)
RDW: 12.9 % (ref 11.5–15.5)
WBC: 11.6 10*3/uL — ABNORMAL HIGH (ref 4.0–10.5)
nRBC: 0 % (ref 0.0–0.2)

## 2020-02-09 LAB — BASIC METABOLIC PANEL
Anion gap: 14 (ref 5–15)
BUN: 36 mg/dL — ABNORMAL HIGH (ref 8–23)
CO2: 24 mmol/L (ref 22–32)
Calcium: 9.2 mg/dL (ref 8.9–10.3)
Chloride: 94 mmol/L — ABNORMAL LOW (ref 98–111)
Creatinine, Ser: 1.03 mg/dL (ref 0.61–1.24)
GFR calc Af Amer: >60 mL/min (ref >60)
GFR calc non Af Amer: >60 mL/min (ref >60)
Glucose, Bld: 335 mg/dL — ABNORMAL HIGH (ref 70–99)
Potassium: 3.8 mmol/L (ref 3.5–5.1)
Sodium: 132 mmol/L — ABNORMAL LOW (ref 135–145)

## 2020-02-09 LAB — HEMOGLOBIN A1C
Hgb A1c MFr Bld: 8.5 % — ABNORMAL HIGH (ref 4.8–5.6)
Mean Plasma Glucose: 197.25 mg/dL

## 2020-02-09 LAB — HIV ANTIBODY (ROUTINE TESTING W REFLEX): HIV Screen 4th Generation wRfx: NONREACTIVE

## 2020-02-09 LAB — GLUCOSE, CAPILLARY
Glucose-Capillary: 267 mg/dL — ABNORMAL HIGH (ref 70–99)
Glucose-Capillary: 224 mg/dL — ABNORMAL HIGH (ref 70–99)

## 2020-02-09 LAB — C-REACTIVE PROTEIN: CRP: 15.5 mg/dL — ABNORMAL HIGH (ref <1.0)

## 2020-02-09 LAB — PREALBUMIN: Prealbumin: 8.4 mg/dL — ABNORMAL LOW (ref 18–38)

## 2020-02-09 LAB — SARS CORONAVIRUS 2 BY RT PCR (HOSPITAL ORDER, PERFORMED IN ~~LOC~~ HOSPITAL LAB): SARS Coronavirus 2: NEGATIVE

## 2020-02-09 LAB — SEDIMENTATION RATE: Sed Rate: 88 mm/hr — ABNORMAL HIGH (ref 0–16)

## 2020-02-09 MED ORDER — MORPHINE SULFATE (PF) 2 MG/ML IV SOLN
2.0000 mg | INTRAVENOUS | Status: DC | PRN
  Administered 2020-02-10 – 2020-02-12 (×2): 2 mg via INTRAVENOUS
  Filled 2020-02-09 (×2): qty 1

## 2020-02-09 MED ORDER — SIMVASTATIN 20 MG PO TABS
20.0000 mg | ORAL_TABLET | Freq: Every day | ORAL | Status: DC
  Administered 2020-02-09 – 2020-02-14 (×6): 20 mg via ORAL
  Filled 2020-02-09 (×6): qty 1

## 2020-02-09 MED ORDER — INSULIN ASPART 100 UNIT/ML ~~LOC~~ SOLN
4.0000 [IU] | Freq: Three times a day (TID) | SUBCUTANEOUS | Status: DC
  Administered 2020-02-09 – 2020-02-15 (×13): 4 [IU] via SUBCUTANEOUS

## 2020-02-09 MED ORDER — METRONIDAZOLE IN NACL 5-0.79 MG/ML-% IV SOLN
500.0000 mg | Freq: Three times a day (TID) | INTRAVENOUS | Status: DC
  Administered 2020-02-09 – 2020-02-10 (×3): 500 mg via INTRAVENOUS
  Administered 2020-02-10: 100 mL via INTRAVENOUS
  Administered 2020-02-11 – 2020-02-13 (×9): 500 mg via INTRAVENOUS
  Filled 2020-02-09 (×13): qty 100

## 2020-02-09 MED ORDER — HYDRALAZINE HCL 20 MG/ML IJ SOLN: 10.0000 mg | Freq: Four times a day (QID) | INTRAMUSCULAR | Status: DC | PRN

## 2020-02-09 MED ORDER — ACETAMINOPHEN 650 MG RE SUPP: 650.0000 mg | Freq: Four times a day (QID) | RECTAL | Status: DC | PRN

## 2020-02-09 MED ORDER — ACETAMINOPHEN 325 MG PO TABS
650.0000 mg | ORAL_TABLET | Freq: Four times a day (QID) | ORAL | Status: DC | PRN
  Filled 2020-02-09: qty 2

## 2020-02-09 MED ORDER — PREGABALIN 50 MG PO CAPS
50.0000 mg | ORAL_CAPSULE | Freq: Every day | ORAL | Status: DC
  Administered 2020-02-09 – 2020-02-14 (×6): 50 mg via ORAL
  Filled 2020-02-09 (×6): qty 1

## 2020-02-09 MED ORDER — ENOXAPARIN SODIUM 40 MG/0.4ML ~~LOC~~ SOLN
40.0000 mg | SUBCUTANEOUS | Status: DC
  Administered 2020-02-09 – 2020-02-14 (×4): 40 mg via SUBCUTANEOUS
  Filled 2020-02-09 (×4): qty 0.4

## 2020-02-09 MED ORDER — HYDROCODONE-ACETAMINOPHEN 5-325 MG PO TABS
1.0000 | ORAL_TABLET | ORAL | Status: DC | PRN
  Administered 2020-02-10 – 2020-02-15 (×9): 1 via ORAL
  Filled 2020-02-09 (×10): qty 1

## 2020-02-09 MED ORDER — AMLODIPINE BESYLATE 5 MG PO TABS
5.0000 mg | ORAL_TABLET | Freq: Every day | ORAL | Status: DC
  Administered 2020-02-09 – 2020-02-15 (×7): 5 mg via ORAL
  Filled 2020-02-09 (×7): qty 1

## 2020-02-09 MED ORDER — INSULIN GLARGINE 100 UNIT/ML ~~LOC~~ SOLN
20.0000 [IU] | Freq: Every day | SUBCUTANEOUS | Status: DC
  Administered 2020-02-09 – 2020-02-14 (×6): 20 [IU] via SUBCUTANEOUS
  Filled 2020-02-09 (×6): qty 0.2

## 2020-02-09 MED ORDER — VANCOMYCIN HCL 1250 MG/250ML IV SOLN
1250.0000 mg | Freq: Two times a day (BID) | INTRAVENOUS | Status: DC
  Administered 2020-02-09 – 2020-02-13 (×9): 1250 mg via INTRAVENOUS
  Filled 2020-02-09 (×9): qty 250

## 2020-02-09 MED ORDER — SODIUM CHLORIDE 0.9 % IV SOLN
2.0000 g | INTRAVENOUS | Status: DC
  Administered 2020-02-09 – 2020-02-13 (×4): 2 g via INTRAVENOUS
  Filled 2020-02-09 (×3): qty 2
  Filled 2020-02-09: qty 20
  Filled 2020-02-09: qty 2

## 2020-02-09 MED ORDER — INSULIN ASPART 100 UNIT/ML ~~LOC~~ SOLN
0.0000 [IU] | Freq: Three times a day (TID) | SUBCUTANEOUS | Status: DC
  Administered 2020-02-09: 18:00:00 8 [IU] via SUBCUTANEOUS
  Administered 2020-02-10: 5 [IU] via SUBCUTANEOUS
  Administered 2020-02-10 (×2): 2 [IU] via SUBCUTANEOUS
  Administered 2020-02-11 – 2020-02-12 (×5): 3 [IU] via SUBCUTANEOUS
  Administered 2020-02-13: 13:00:00 2 [IU] via SUBCUTANEOUS
  Administered 2020-02-13 – 2020-02-14 (×5): 3 [IU] via SUBCUTANEOUS
  Administered 2020-02-15: 08:00:00 2 [IU] via SUBCUTANEOUS

## 2020-02-09 NOTE — Addendum Note (Signed)
Addended by: Rip Harbour on: 02/09/2020 05:04 PM   Modules accepted: Orders

## 2020-02-09 NOTE — H&P (Signed)
History and Physical        Hospital Admission Note Date: 02/09/2020  Patient name: Aaron [name] Medical record number: [id number] Date of birth: 08/22/1950 Age: 69 y.o. Gender: male  PCP: Jacelyn Pi, MD  Patient coming from: Home Lives with: Wife At baseline, ambulates: Independently  Chief Complaint    No chief complaint on file.     HPI:   This is a 69 year old male with past medical history of insulin-dependent type 2 diabetes, hypertension, hyperlipidemia, right foot diabetic ulcer for which he follows podiatry who was doing well until this weekend when he began having fevers, chills and night sweats and presented to the ED on 7/25 at which time the ED physician performed an incision and drainage, gave a dose of ceftriaxone and discharged with Augmentin with podiatry follow-up.  Patient presented to his podiatry office today with Dr. Sherryle Lis who noted his right foot to be warm with erythema, necrosis and purulent drainage concerning for cellulitis and abscess of his right foot and was a direct admit to [facility] long hospital.  Patient stated he only picked up the Augmentin this a.m. and only got 1 dose of this.  Currently feels well when compared to yesterday without any complaints  Regarding the patient's ED visit on 7/25  leukocytosis at 16.9 thousand with a left shift of absolute neutrophils at 15.3, ESR of 100, temp in ED of 98.1 degrees, plain film x-rays showed subcutaneous gas within the first ray that does not extend into the foot.  Had I&D, ceftriaxone and Augmentin at discharge.  Vitals:   02/09/20 1331  BP: (!) 130/71  Pulse: 81  Resp: 16  Temp: 98.4 F (36.9 C)  SpO2: 99%     Review of Systems:  Review of Systems  Constitutional: Negative for chills, fever and malaise/fatigue.  Respiratory: Negative for cough and shortness of breath.     Cardiovascular: Negative for chest pain and palpitations.  Gastrointestinal: Negative for nausea and vomiting.  Genitourinary: Negative.   Musculoskeletal: Negative for myalgias.  All other systems reviewed and are negative.   Medical/Social/Family History   Past Medical History: Past Medical History:  Diagnosis Date  . Diabetes mellitus without complication ([place])    type 2  . Hyperlipidemia   . Hypertension     Past Surgical History:  Procedure Laterality Date  . BACK SURGERY  30 yrs ago   lower  . CYSTOSCOPY WITH BIOPSY N/A 09/08/2019   Procedure: CYSTOSCOPY WITH BLADDER BIOPSY;  Surgeon: Kathie Rhodes, MD;  Location: [facility];  Service: Urology;  Laterality: N/A;  ONLY NEEDS 30 MIN    Medications: Prior to Admission medications   Medication Sig Start Date End Date Taking? Authorizing Provider  amoxicillin-clavulanate (AUGMENTIN) 875-125 MG tablet Take 1 tablet by mouth 2 (two) times daily. 02/08/20   Virgel Manifold, MD  Canagliflozin-metFORMIN HCl (INVOKAMET) 50-1000 MG TABS Take by mouth at bedtime.     [provider]  HYDROcodone-acetaminophen (NORCO/VICODIN) 5-325 MG tablet Take 1 tablet by mouth every 4 (four) hours as needed. 02/08/20   Virgel Manifold, MD  insulin NPH-regular Human (70-30) 100 UNIT/ML injection Inject 30-50 Units into the skin See admin instructions. Inject 50 units subcutaneous  in the morning and inject 30 to 50 units subcutaneous at night (depending on blood sugar reading).    [provider]  lisinopril-hydrochlorothiazide (ZESTORETIC) 20-25 MG tablet Take 1 tablet by mouth at bedtime.     [provider]  phenazopyridine (PYRIDIUM) 200 MG tablet Take 1 tablet (200 mg total) by mouth 3 (three) times daily as needed for pain. Patient not taking: Reported on 02/08/2020 09/08/19   Kathie Rhodes, MD  pregabalin (LYRICA) 50 MG capsule Take 50 mg by mouth at bedtime.     [provider]  simvastatin (ZOCOR)  20 MG tablet Take 20 mg by mouth at bedtime.     [provider]    Allergies:  No Known Allergies  Social History:  reports that he has quit smoking. His smoking use included cigarettes. He has a 10.00 pack-year smoking history. He has never used smokeless tobacco. He reports previous alcohol use. He reports that he does not use drugs.  Family History: Family History  Problem Relation Age of Onset  . Diabetes Father      Objective   Physical Exam: Blood pressure (!) 130/71, pulse 81, temperature 98.4 F (36.9 C), temperature source Oral, resp. rate 16, height 6' 2"  (1.88 m), weight 89.3 kg, SpO2 99 %.  Physical Exam Vitals and nursing note reviewed.  Constitutional:      Appearance: Normal appearance.  HENT:     Head: Normocephalic and atraumatic.  Eyes:     Conjunctiva/sclera: Conjunctivae normal.  Cardiovascular:     Rate and Rhythm: Normal rate and regular rhythm.  Pulmonary:     Effort: Pulmonary effort is normal.     Breath sounds: Normal breath sounds.  Abdominal:     General: Abdomen is flat.     Palpations: Abdomen is soft.  Musculoskeletal:     Comments: Right foot with dressing clean dry and intact  Skin:    Coloration: Skin is not jaundiced or pale.  Neurological:     Mental Status: He is alert. Mental status is at baseline.  Psychiatric:        Mood and Affect: Mood normal.        Behavior: Behavior normal.     LABS on Admission: I have personally reviewed all the labs and imaging below    Basic Metabolic Panel: Recent Labs  Lab 02/08/20 1439 02/09/20 1409  NA 135 132*  K 4.8 3.8  CL 98 94*  CO2 22 24  GLUCOSE 326* 335*  BUN 37* 36*  CREATININE 1.06 1.03  CALCIUM 9.5 9.2   Liver Function Tests: No results for input(s): AST, ALT, ALKPHOS, BILITOT, PROT, ALBUMIN in the last 168 hours. No results for input(s): LIPASE, AMYLASE in the last 168 hours. No results for input(s): AMMONIA in the last 168 hours. CBC: Recent Labs  Lab  02/08/20 1439 02/08/20 1439 02/09/20 1409  WBC 16.9*  --  11.6*  NEUTROABS 15.3*   < > 10.0*  HGB 13.7  --  12.7*  HCT 42.3  --  38.8*  MCV 90.2   < > 89.8  PLT 292  --  299   < > = values in this interval not displayed.   Cardiac Enzymes: No results for input(s): CKTOTAL, CKMB, CKMBINDEX, TROPONINI in the last 168 hours. BNP: Invalid input(s): POCBNP CBG: Recent Labs  Lab 02/09/20 1631  GLUCAP 267*    Radiological Exams on Admission:  DG Foot 2 Views Right  Result Date: 02/08/2020 CLINICAL DATA:  Diabetic ulceration,  foot swelling EXAM: RIGHT FOOT - 2 VIEW COMPARISON:  01/26/2020 FINDINGS: Extensive soft tissue swelling involving the right forefoot has progressed since prior examination. There has also, now all, developed subcutaneous gas between the first and second digits within the interspace no associated rows of changes within the adjacent osseous structures to suggest osteomyelitis. Well-circumscribed lytic lesion within the proximal phalanx of the great toe may represent a a unicameral bone cyst or phalangeal and chondroma. 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 Mild midfoot degenerative arthritis. Moderate plantar calcaneal spur. Vascular calcifications are noted within the soft tissues. IMPRESSION: Interval development of plantar wound with extensive soft tissue swelling involving the right forefoot and subcutaneous gas in keeping with aggressive infection. No associated osseous erosive changes. Hallux rigidus Electronically Signed   By: Fidela Salisbury MD   On: 02/08/2020 15:01      EKG: Not done at this time    A & P   Principal Problem:   Diabetic ulcer of right foot associated with type 2 diabetes mellitus ([place]) Active Problems:   Neuropathy due to type 2 diabetes mellitus ([place])   Foot abscess   1. Right infected diabetic foot ulcer a. Afebrile,  improved WBC 16.9-> 11.6, ESR 100-> 88, CRP 15.5 b. Continue broad-spectrum antibiotic vancomycin/ceftriaxone/metronidazole c. Continue as needed opiates for pain management d. N.p.o. after midnight for OR in a.m.  2. Hyperglycemia in setting of type 2 diabetes  a. Hold patient's home oral medications and insulin and start on basal bolus dosing with sliding scale at meals b. Check HA1C c. Continue lyrica for neuropathy  3. Hypertension a. will hold home lisinopril-HCTZ for now to prevent AKI b. Start on amlodipine and add as needed hydralazine instead  4. Hyperlipidemia a. Continue statin   DVT prophylaxis: Lovenox   Code Status: Full Code  Diet: Heart healthy/carb controlled, n.p.o. after midnight Family Communication: Admission, patients condition and plan of care including tests being ordered have been discussed with the patient who indicates understanding and agrees with the plan and Code Status. Patient's wife at bedside was updated  Disposition Plan: The appropriate patient status for this patient is INPATIENT. Inpatient status is judged to be reasonable and necessary in order to provide the required intensity of service to ensure the patient's safety. The patient's presenting symptoms, physical exam findings, and initial radiographic and laboratory data in the context of their chronic comorbidities is felt to place them at high risk for further clinical deterioration. Furthermore, it is not anticipated that the patient will be medically stable for discharge from the hospital within 2 midnights of admission. The following factors support the patient status of inpatient.   " The patient's presenting symptoms include right foot pain and purulent drainage. " The worrisome physical exam findings include right foot with surgical dressing clean dry and intact. " The initial radiographic and laboratory data are worrisome because of improved leukocytosis. " The chronic co-morbidities include  poorly controlled type 2 diabetes chronic foot ulcer.   * I certify that at the point of admission it is my clinical judgment that the patient will require inpatient hospital care spanning beyond 2 midnights from the point of admission due to high intensity of service, high risk for further deterioration and high frequency of surveillance required.*   Status is: Inpatient  Remains inpatient appropriate because:Ongoing diagnostic testing needed not appropriate for outpatient work up and IV treatments appropriate due  to intensity of illness or inability to take PO   Dispo: The patient is from: Home              Anticipated d/c is to: Home              Anticipated d/c date is: 2 days              Patient currently is not medically stable to d/c.     Consultants  . podiatry  Procedures  . none  Time Spent on Admission: 68 minutes    Harold Hedge, DO [facility] Pager [phone] 02/09/2020, 4:42 PM

## 2020-02-09 NOTE — Progress Notes (Signed)
Entered in error

## 2020-02-09 NOTE — Consult Note (Signed)
WOC Nurse Consult Note: Reason for Consult:Right foot wound between first and second metatarsal.  Neuropathic ulcer infected.  Podiatry has evaluated and plans operative intervention tomorrow.  No WOC needs at this time.  Wound type:infectious  neuropathic Pressure Injury POA:/NA Measurement: 4 cm x 3 cm x 0.2 induration and maroon discoloration with epithelial sloughing  Wound YVD:PBAQVO and pink Drainage (amount, consistency, odor) moderate serosanguinous  Musty odor.  Periwound:edema and maceration Dressing procedure/placement/frequency:Cleanse right foot wound with NS and pat dry. Apply Xeroform gauze to wound bed. Top with dry dressing and kerlix/tape.  Change daily.  Will not follow at this time.  Please re-consult if needed.  Domenic Moras MSN, RN, FNP-BC CWON Wound, Ostomy, Continence Nurse Pager [phone]

## 2020-02-09 NOTE — Progress Notes (Signed)
Subjective:  Patient ID: Aaron [name], male    DOB: April 19, 1951,  MRN: [id number]  Chief Complaint  Patient presents with  . Foot Ulcer    Pt has new wound on top of foot by great toe- started having a fever over the weekend- swelling and reddness- w odor - pt was seen in the ER- are here to get evaluation and follow up     69 y.o. male presents with the above complaint. History confirmed with patient.  He is well-known to me from our previous wound care visits for his ulcer on the right foot which is been present since early to mid June of this year.  I last saw him on 02/05/2020 where his wound was stable and was debrided.  He and his wife state that he did well through the weekend until yesterday morning when he developed fevers and chills and noticed redness and severe drainage coming from the foot, most of the seem to be coming from the first interspace on the dorsal part of the big toe.  They went to the [facility] emergency room where they waited for some time, but then labs and x-rays were taken, and he was discharged on oral Augmentin.  Did not start taking the Augmentin yet, they just received it from the pharmacy this morning.  Review of work-up in the emergency room shows leukocytosis at 16.9 thousand with a left shift of absolute neutrophils at 15.3, ESR of 100, temp in ED of 98.1 degrees, plain film x-rays showed subcutaneous gas within the first ray that does not extend into the foot.  Objective:  Physical Exam: Right foot is warm with good capillary refill, palpable pedal pulses, erythema surrounding the great toe and first interspace extending to the mid level of the metatarsals.  Necrosis of the skin in the first webspace with significant malodor, and purulent drainage.  Ulcer plantarly appears stable and consistent with previous exam.  Assessment:   1. Cellulitis and abscess of foot, except toes   2. Ulcer of right foot with necrosis of muscle ([place])   3. Diabetic ulcer of  right foot associated with type 2 diabetes mellitus, unspecified part of foot, unspecified ulcer stage ([place])   4. Neuropathy due to type 2 diabetes mellitus ([facility])   5. Charcot's joint of foot, right      Plan:  Patient was evaluated and treated and all questions answered.  -Today, since last visit on 02/05/2020 he has developed a severe diabetic foot infection of the first interspace on the right foot.  The likely source of this is from the plantar wound.  I performed a incision and drainage today in the office and took a deep wound culture.  While he has not started the Augmentin yet, I do think that he has early tissue loss and this is not likely to improve with local wound care and antibiotics.  I recommended that he admitted to the hospital for operative intervention and debridement for washout tomorrow.  I discussed with his case with the hospitalist at [facility], Dr. Marva Panda, MD.  He will be admitted directly to the hospital today and, we will plan for operative intervention tomorrow.  I discussed with him and his wife that he is at significant risk for further infection and potential limb loss, and this potentially could have developed into a septic infection within a few days.  Further outpatient care will be delivered postoperatively   Procedure note: -After sterile prep with Betadine,  an incision in the first webspace was made with a #15 blade.  A deep wound culture was taken.  The wound was thoroughly irrigated with normal sterile saline and hydrogen peroxide.  3 to 5 cc of watery purulent drainage was expressed from the wound.  No bone was exposed on my exam currently.  The wound was then dressed with iodoform packing, and dry sterile dressings.  Iodosorb ointment was applied to the wound base plantarly.  Lanae Crumbly, DPM 02/09/2020      Return for after surgery.

## 2020-02-09 NOTE — Progress Notes (Signed)
Pharmacy Antibiotic Note  Aaron [name] is a 69 y.o. male admitted on 02/09/2020 with DM foot infection.  Pharmacy has been consulted for vancomycin dosing. No Hx in Epic, but MD feels pt at high risk for MRSA involvement.  Plan:  Vancomycin 1250 mg IV q12 hr   Measure vancomycin levels at steady state as indicated  SCr q48 while on vanc  Rocephin and Flagyl per MD; dosing appropriate     Temp (24hrs), Avg:98.3 F (36.8 C), Min:98.2 F (36.8 C), Max:98.4 F (36.9 C)  Recent Labs  Lab 02/08/20 1439  WBC 16.9*  CREATININE 1.06    CrCl cannot be calculated (Unknown ideal weight.).    No Known Allergies  Antimicrobials this admission: 7/26 vanc >>  7/26 Rocephin >>  7/26 Flagyl >>  Dose adjustments this admission: n/a  Microbiology results: None  Thank you for allowing pharmacy to be a part of this patient's care.  Geri Hepler A 02/09/2020 2:05 PM

## 2020-02-10 ENCOUNTER — Encounter (HOSPITAL_COMMUNITY): Payer: Medicare Other

## 2020-02-10 ENCOUNTER — Encounter (HOSPITAL_COMMUNITY)
Admission: AD | Disposition: A | Discharge status: Home / Self Care | Payer: Self-pay | Source: Ambulatory Visit | Attending: Internal Medicine

## 2020-02-10 ENCOUNTER — Inpatient Hospital Stay (HOSPITAL_COMMUNITY): Payer: Medicare Other | Admitting: Certified Registered Nurse Anesthetist

## 2020-02-10 ENCOUNTER — Encounter (HOSPITAL_COMMUNITY): Payer: Self-pay | Admitting: Internal Medicine

## 2020-02-10 DIAGNOSIS — E876 Hypokalemia: Secondary | ICD-10-CM

## 2020-02-10 DIAGNOSIS — L97513 Non-pressure chronic ulcer of other part of right foot with necrosis of muscle: Secondary | ICD-10-CM

## 2020-02-10 DIAGNOSIS — E1165 Type 2 diabetes mellitus with hyperglycemia: Secondary | ICD-10-CM

## 2020-02-10 DIAGNOSIS — L03115 Cellulitis of right lower limb: Secondary | ICD-10-CM

## 2020-02-10 HISTORY — PX: INCISION AND DRAINAGE: SHX5863

## 2020-02-10 LAB — SURGICAL PCR SCREEN
MRSA, PCR: NEGATIVE
Staphylococcus aureus: NEGATIVE

## 2020-02-10 LAB — CBC
HCT: 37.8 % — ABNORMAL LOW (ref 39.0–52.0)
Hemoglobin: 12.2 g/dL — ABNORMAL LOW (ref 13.0–17.0)
MCH: 28.9 pg (ref 26.0–34.0)
MCHC: 32.3 g/dL (ref 30.0–36.0)
MCV: 89.6 fL (ref 80.0–100.0)
Platelets: 271 10*3/uL (ref 150–400)
RBC: 4.22 MIL/uL (ref 4.22–5.81)
RDW: 13.1 % (ref 11.5–15.5)
WBC: 9 10*3/uL (ref 4.0–10.5)
nRBC: 0 % (ref 0.0–0.2)

## 2020-02-10 LAB — GLUCOSE, CAPILLARY
Glucose-Capillary: 203 mg/dL — ABNORMAL HIGH (ref 70–99)
Glucose-Capillary: 135 mg/dL — ABNORMAL HIGH (ref 70–99)

## 2020-02-10 LAB — BASIC METABOLIC PANEL
Anion gap: 10 (ref 5–15)
BUN: 26 mg/dL — ABNORMAL HIGH (ref 8–23)
CO2: 25 mmol/L (ref 22–32)
Calcium: 8.9 mg/dL (ref 8.9–10.3)
Chloride: 101 mmol/L (ref 98–111)
Creatinine, Ser: 0.74 mg/dL (ref 0.61–1.24)
GFR calc Af Amer: >60 mL/min (ref >60)
GFR calc non Af Amer: >60 mL/min (ref >60)
Glucose, Bld: 175 mg/dL — ABNORMAL HIGH (ref 70–99)
Potassium: 3.4 mmol/L — ABNORMAL LOW (ref 3.5–5.1)
Sodium: 136 mmol/L (ref 135–145)

## 2020-02-10 SURGERY — INCISION AND DRAINAGE
Anesthesia: Monitor Anesthesia Care | Laterality: Right

## 2020-02-10 MED ORDER — PHENYLEPHRINE HCL (PRESSORS) 10 MG/ML IV SOLN
INTRAVENOUS | Status: DC | PRN
Start: 2020-02-10 — End: 2020-02-10
  Administered 2020-02-10 (×3): 80 ug via INTRAVENOUS
  Administered 2020-02-10: 100 ug via INTRAVENOUS

## 2020-02-10 MED ORDER — ONDANSETRON HCL 4 MG/2ML IJ SOLN: 4.0000 mg | Freq: Once | INTRAMUSCULAR | Status: DC | PRN

## 2020-02-10 MED ORDER — GLYCOPYRROLATE 0.2 MG/ML IJ SOLN
INTRAMUSCULAR | Status: DC | PRN
Start: 2020-02-10 — End: 2020-02-10
  Administered 2020-02-10: .1 mg via INTRAVENOUS

## 2020-02-10 MED ORDER — VANCOMYCIN HCL 1000 MG IV SOLR
INTRAVENOUS | Status: AC
  Filled 2020-02-10: qty 1000

## 2020-02-10 MED ORDER — BUPIVACAINE HCL 0.5 % IJ SOLN
INTRAMUSCULAR | Status: DC | PRN
  Administered 2020-02-10: 10 mL

## 2020-02-10 MED ORDER — LIDOCAINE 2% (20 MG/ML) 5 ML SYRINGE
INTRAMUSCULAR | Status: AC
  Filled 2020-02-10: qty 5

## 2020-02-10 MED ORDER — ONDANSETRON HCL 4 MG/2ML IJ SOLN
INTRAMUSCULAR | Status: AC
  Filled 2020-02-10: qty 2

## 2020-02-10 MED ORDER — PROPOFOL 500 MG/50ML IV EMUL
INTRAVENOUS | Status: DC | PRN
  Administered 2020-02-10 (×2): 20 mg via INTRAVENOUS

## 2020-02-10 MED ORDER — OXYCODONE HCL 5 MG/5ML PO SOLN: 5.0000 mg | Freq: Once | ORAL | Status: DC | PRN

## 2020-02-10 MED ORDER — SODIUM CHLORIDE 0.9 % IV SOLN: INTRAVENOUS | Status: DC | PRN

## 2020-02-10 MED ORDER — FENTANYL CITRATE (PF) 100 MCG/2ML IJ SOLN
INTRAMUSCULAR | Status: AC
  Filled 2020-02-10: qty 2

## 2020-02-10 MED ORDER — PROPOFOL 500 MG/50ML IV EMUL
INTRAVENOUS | Status: DC | PRN
  Administered 2020-02-10: 75 ug/kg/min via INTRAVENOUS

## 2020-02-10 MED ORDER — POTASSIUM CHLORIDE 10 MEQ/100ML IV SOLN
10.0000 meq | INTRAVENOUS | Status: DC
  Filled 2020-02-10: qty 100

## 2020-02-10 MED ORDER — FENTANYL CITRATE (PF) 100 MCG/2ML IJ SOLN: 25.0000 ug | INTRAMUSCULAR | Status: DC | PRN

## 2020-02-10 MED ORDER — EPHEDRINE 5 MG/ML INJ
INTRAVENOUS | Status: AC
  Filled 2020-02-10: qty 10

## 2020-02-10 MED ORDER — LIDOCAINE HCL (CARDIAC) PF 100 MG/5ML IV SOSY
PREFILLED_SYRINGE | INTRAVENOUS | Status: DC | PRN
  Administered 2020-02-10: 40 mg via INTRATRACHEAL

## 2020-02-10 MED ORDER — BUPIVACAINE HCL (PF) 0.5 % IJ SOLN
INTRAMUSCULAR | Status: AC
  Filled 2020-02-10: qty 30

## 2020-02-10 MED ORDER — SODIUM CHLORIDE 0.9 % IR SOLN
Status: DC | PRN
  Administered 2020-02-10: 3000 mL

## 2020-02-10 MED ORDER — PHENYLEPHRINE 40 MCG/ML (10ML) SYRINGE FOR IV PUSH (FOR BLOOD PRESSURE SUPPORT)
PREFILLED_SYRINGE | INTRAVENOUS | Status: AC
  Filled 2020-02-10: qty 10

## 2020-02-10 MED ORDER — POTASSIUM CHLORIDE CRYS ER 20 MEQ PO TBCR
40.0000 meq | EXTENDED_RELEASE_TABLET | Freq: Once | ORAL | Status: AC
  Administered 2020-02-10: 11:00:00 40 meq via ORAL
  Filled 2020-02-10: qty 2

## 2020-02-10 MED ORDER — EPHEDRINE SULFATE 50 MG/ML IJ SOLN
INTRAMUSCULAR | Status: DC | PRN
  Administered 2020-02-10: 10 mg via INTRAVENOUS
  Administered 2020-02-10: 5 mg via INTRAVENOUS

## 2020-02-10 MED ORDER — FENTANYL CITRATE (PF) 100 MCG/2ML IJ SOLN
INTRAMUSCULAR | Status: DC | PRN
  Administered 2020-02-10 (×2): 25 ug via INTRAVENOUS

## 2020-02-10 MED ORDER — POTASSIUM CHLORIDE 10 MEQ/100ML IV SOLN: 10.0000 meq | INTRAVENOUS | Status: DC

## 2020-02-10 MED ORDER — OXYCODONE HCL 5 MG PO TABS: 5.0000 mg | ORAL_TABLET | Freq: Once | ORAL | Status: DC | PRN

## 2020-02-10 MED ORDER — LACTATED RINGERS IV SOLN: INTRAVENOUS | Status: DC | PRN

## 2020-02-10 MED ORDER — ONDANSETRON HCL 4 MG/2ML IJ SOLN
INTRAMUSCULAR | Status: DC | PRN
  Administered 2020-02-10: 4 mg via INTRAVENOUS

## 2020-02-10 MED ORDER — 0.9 % SODIUM CHLORIDE (POUR BTL) OPTIME
TOPICAL | Status: DC | PRN
  Administered 2020-02-10: 1000 mL

## 2020-02-10 SURGICAL SUPPLY — 36 items
BLADE HEX COATED 2.75 (ELECTRODE) ×3 IMPLANT
BLADE SURG 15 STRL LF DISP TIS (BLADE) ×1 IMPLANT
BLADE SURG 15 STRL SS (BLADE) ×3
BNDG COHESIVE 4X5 TAN STRL (GAUZE/BANDAGES/DRESSINGS) IMPLANT
BNDG CONFORM 4 STRL LF (GAUZE/BANDAGES/DRESSINGS) ×6 IMPLANT
BNDG ELASTIC 3X5.8 VLCR STR LF (GAUZE/BANDAGES/DRESSINGS) ×3 IMPLANT
BNDG ELASTIC 4X5.8 VLCR STR LF (GAUZE/BANDAGES/DRESSINGS) ×3 IMPLANT
BNDG ESMARK 4X9 LF (GAUZE/BANDAGES/DRESSINGS) ×3 IMPLANT
BNDG GAUZE ELAST 4 BULKY (GAUZE/BANDAGES/DRESSINGS) ×3 IMPLANT
CHLORAPREP W/TINT 26 (MISCELLANEOUS) ×3 IMPLANT
COVER BACK TABLE 60X90IN (DRAPES) ×3 IMPLANT
CUFF TOURN SGL QUICK 18X4 (TOURNIQUET CUFF) ×3 IMPLANT
DRAPE POUCH INSTRU U-SHP 10X18 (DRAPES) ×3 IMPLANT
DRAPE SHEET LG 3/4 BI-LAMINATE (DRAPES) ×6 IMPLANT
DRSG PAD ABDOMINAL 8X10 ST (GAUZE/BANDAGES/DRESSINGS) ×3 IMPLANT
GAUZE SPONGE 4X4 12PLY STRL (GAUZE/BANDAGES/DRESSINGS) ×6 IMPLANT
GAUZE XEROFORM 1X8 LF (GAUZE/BANDAGES/DRESSINGS) IMPLANT
GLOVE BIO SURGEON STRL SZ7.5 (GLOVE) ×3 IMPLANT
GLOVE BIOGEL PI IND STRL 8 (GLOVE) ×1 IMPLANT
GLOVE BIOGEL PI INDICATOR 8 (GLOVE) ×2
GOWN STRL REUS W/TWL XL LVL3 (GOWN DISPOSABLE) ×6 IMPLANT
KIT BASIN OR (CUSTOM PROCEDURE TRAY) ×3 IMPLANT
KIT TURNOVER KIT A (KITS) IMPLANT
NEEDLE HYPO 25X1 1.5 SAFETY (NEEDLE) ×3 IMPLANT
PENCIL SMOKE EVACUATOR (MISCELLANEOUS) IMPLANT
SET IRRIG Y TYPE TUR BLADDER L (SET/KITS/TRAYS/PACK) IMPLANT
STAPLER VISISTAT 35W (STAPLE) ×3 IMPLANT
STOCKINETTE 8 INCH (MISCELLANEOUS) ×3 IMPLANT
SUT ETHILON 4 0 PS 2 18 (SUTURE) IMPLANT
SUT MNCRL AB 3-0 PS2 18 (SUTURE) IMPLANT
SUT MON AB 5-0 PS2 18 (SUTURE) IMPLANT
SUT VIC AB 3-0 PS2 18 (SUTURE)
SUT VIC AB 3-0 PS2 18XBRD (SUTURE) IMPLANT
SUT VICRYL 4-0 PS2 18IN ABS (SUTURE) ×3 IMPLANT
SYR CONTROL 10ML LL (SYRINGE) ×3 IMPLANT
YANKAUER SUCT BULB TIP 10FT TU (MISCELLANEOUS) ×3 IMPLANT

## 2020-02-10 NOTE — Brief Op Note (Signed)
02/10/2020  4:42 PM  PATIENT:  Aaron [name]  69 y.o. male  PRE-OPERATIVE DIAGNOSIS:  diabetic foot infection right foot  POST-OPERATIVE DIAGNOSIS:  diabetic foot infection right foot  PROCEDURE:  Procedure(s): INCISION AND DRAINAGE (Right)  SURGEON:  Surgeon(s) and Role:    * Leslieann Whisman, Stephan Minister, DPM - Primary  PHYSICIAN ASSISTANT:   ASSISTANTS: none   ANESTHESIA:   local and IV sedation, 10cc 0.5% marcaine plain  EBL:  50 mL   BLOOD ADMINISTERED:none  DRAINS: none   LOCAL MEDICATIONS USED:  MARCAINE    10cc  SPECIMEN:  Deep wound culture R foot  DISPOSITION OF SPECIMEN:  microbiology  COUNTS:  YES  TOURNIQUET:  * No tourniquets in log *  DICTATION: .Note written in [place]: return to inpatient unit, plan for return to OR 7/29  PATIENT DISPOSITION:  PACU - hemodynamically stable.   Delay start of Pharmacological VTE agent (>24hrs) due to surgical blood loss or risk of bleeding: yes   Lanae Crumbly, DPM 02/10/2020

## 2020-02-10 NOTE — Progress Notes (Signed)
Had to stop infusion of Potassium as it burned too bad, per pt's request. Will notify MD however, he approved this action earlier.

## 2020-02-10 NOTE — Progress Notes (Signed)
Dr. Sherryle Lis said to hold Lovenox for tonight.

## 2020-02-10 NOTE — Anesthesia Postprocedure Evaluation (Signed)
Anesthesia Post Note  Patient: [name]  Procedure(s) Performed: INCISION AND DRAINAGE (Right )     Patient location during evaluation: PACU Anesthesia Type: MAC Level of consciousness: awake and alert and oriented Pain management: pain level controlled Vital Signs Assessment: post-procedure vital signs reviewed and stable Respiratory status: spontaneous breathing, nonlabored ventilation and respiratory function stable Cardiovascular status: stable and blood pressure returned to baseline Postop Assessment: no apparent nausea or vomiting Anesthetic complications: no   No complications documented.  Last Vitals:  Vitals:   02/10/20 1645 02/10/20 1700  BP: (!) 148/73 (!) 163/83  Pulse: 78 79  Resp: 16 14  Temp:    SpO2: 100% 100%    Last Pain:  Vitals:   02/10/20 1700  TempSrc:   PainSc: 0-No pain                 Yailene Badia A.

## 2020-02-10 NOTE — Anesthesia Preprocedure Evaluation (Signed)
Anesthesia Evaluation  Patient identified by MRN, date of birth, ID band Patient awake    Reviewed: Allergy & Precautions, NPO status , Patient's Chart, lab work & pertinent test results  Airway Mallampati: II  TM Distance: >3 FB Neck ROM: Full    Dental  (+) Edentulous Upper, Edentulous Lower   Pulmonary former smoker,    Pulmonary exam normal breath sounds clear to auscultation       Cardiovascular hypertension, Pt. on medications Normal cardiovascular exam Rhythm:Regular Rate:Normal     Neuro/Psych negative neurological ROS  negative psych ROS   GI/Hepatic negative GI ROS, Neg liver ROS,   Endo/Other  diabetes, Poorly Controlled, Type 2, Insulin Dependent, Oral Hypoglycemic AgentsHyperlipidemia  Renal/GU negative Renal ROS  negative genitourinary   Musculoskeletal Diabetic foot ulcer   Abdominal   Peds  Hematology  (+) anemia ,   Anesthesia Other Findings   Reproductive/Obstetrics                             Anesthesia Physical Anesthesia Plan  ASA: III  Anesthesia Plan: MAC   Post-op Pain Management:    Induction: Intravenous  PONV Risk Score and Plan: 2 and Propofol infusion, Ondansetron and Treatment may vary due to age or medical condition  Airway Management Planned: Natural Airway and Simple Face Mask  Additional Equipment:   Intra-op Plan:   Post-operative Plan:   Informed Consent: I have reviewed the patients History and Physical, chart, labs and discussed the procedure including the risks, benefits and alternatives for the proposed anesthesia with the patient or authorized representative who has indicated his/her understanding and acceptance.       Plan Discussed with: CRNA and Anesthesiologist  Anesthesia Plan Comments:         Anesthesia Quick Evaluation

## 2020-02-10 NOTE — Op Note (Signed)
Patient Name: Aaron [name] DOB: 03/20/1951  MRN: [id number]   Date of Service:  02/10/2020  Surgeon: Dr. Lanae Crumbly, DPM Assistants: None Pre-operative Diagnosis:  1.  Diabetic foot infection of right foot with necrosis of muscle 2.  Abscess and cellulitis of right foot 3.  Neuropathic arthropathy of right foot  Post-operative Diagnosis:  1.  Diabetic foot infection of right foot with necrosis of muscle 2.  Abscess and cellulitis of right foot 3.  Neuropathic arthropathy of right foot  Procedures:  1) incision and drainage of abscess and deep space infection below the fascia, multiple areas and planes Pathology/Specimens: ID Type Source Tests Collected by Time Destination  A : Deep Wound culture of right foot. Wound Wound AEROBIC/ANAEROBIC CULTURE (SURGICAL/DEEP WOUND) Criselda Peaches, DPM 02/10/2020 1548    Anesthesia: IV sedation with 10 cc 0.5% Marcaine ankle block Hemostasis: Electrocautery Estimated Blood Loss: 50 mL Materials: None Complications: None  Indications for Procedure:  This is a 69 y.o. male with a history of type 2 diabetes, peripheral neuropathy of the lower extremities, neuropathic arthropathy of the first metatarsophalangeal joint of the right foot, diabetic foot ulcer of the plantar first metatarsal phalangeal joint.  On 02/08/2020 he developed a severe infection of his right foot extending from the wound into the dorsal first interspace.  I evaluated him in the office on 02/09/2020 and admitted him to the hospital for operative intervention   Procedure in Detail: Patient was identified in pre-operative holding area. Formal consent was signed and the right lower extremity was marked. Patient was brought back to the operating room. Anesthesia was induced. The extremity was prepped and draped in the usual sterile fashion. Timeout was taken to confirm patient name, laterality, and procedure prior to incision.   Attention was then directed to the right foot  where a significant amount of edema and necrosis of the webspace of the first interspace was observed.  A linear incision was made from the dorsal interspace to the plantar interspace connecting to the plantar ulceration.  A significant amount of purulent drainage (approximately 5 cc) and necrotic skin, subcutaneous tissue and muscle was encountered.  A deep wound culture was taken and sent for microbiology culture.  All devitalized, necrotic, and nonviable tissue was excised sharply with a #10 blade and a rongeur.  The infection did not communicate with the bone or joint of the metatarsal phalangeal joint or the interphalangeal joint of the hallux.  It did extend to the level of the capsule, that appeared to be healthy and viable.  Hemostat was used to explore the plantar wound, this had a sinus tract which extended to the medial hallux, a counterincision was made here to allow for drainage from the medial side of the wound.  The wound was then irrigated with 3 L normal sterile saline via pulse lavage irrigation system.  Following irrigation the wound was explored once more, a small amount of necrotic material still encountered dorsally on the hallux and this was excised sharply.  The incision was extended onto the dorsal hallux distal phalanx.  Once the debridement was completed and all devitalized tissue had been excised, the foot was again irrigated with normal sterile saline.  Hemostasis was achieved with electrocautery.  The wound was packed open with saline moistened gauze, gauze packing was placed through the wound plantarly and medially around the hallux and through the counterincision medially.  This was followed by gauze roll and Kerlix, ABD pads, and an Ace wrap.  Patient  was then transported to the postanesthesia care unit.  The patient tolerated procedure well.  Disposition: Following a period of post-operative monitoring, patient will be transferred back to his inpatient unit with a plan return to  the operating room in 2 to 3 days.  Lanae Crumbly, DPM 02/10/2020

## 2020-02-10 NOTE — Transfer of Care (Addendum)
Immediate Anesthesia Transfer of Care Note  Patient: Aaron [name]  Procedure(s) Performed: INCISION AND DRAINAGE (Right )  Patient Location: PACU  Anesthesia Type:MAC  Level of Consciousness: awake, alert , oriented and patient cooperative  Airway & Oxygen Therapy: Patient Spontanous Breathing and Patient connected to face mask  Post-op Assessment: Report given to RN and Post -op Vital signs reviewed and stable  Post vital signs: Reviewed and stable  Last Vitals:  Vitals Value Taken Time  BP 152/87 02/10/20 1741  Temp 36.3 C 02/10/20 1741  Pulse 78 02/10/20 1741  Resp 16 02/10/20 1741  SpO2 99 % 02/10/20 1741    Last Pain:  Vitals:   02/10/20 1741  TempSrc: Oral  PainSc:          Complications: No complications documented.

## 2020-02-10 NOTE — Progress Notes (Signed)
Inpatient Diabetes Program Recommendations  AACE/ADA: New Consensus Statement on Inpatient Glycemic Control (2015)  Target Ranges:  Prepandial:   less than 140 mg/dL      Peak postprandial:   less than 180 mg/dL (1-2 hours)      Critically ill patients:  140 - 180 mg/dL   Lab Results  Component Value Date   GLUCAP 203 (H) 02/10/2020   HGBA1C 8.5 (H) 02/09/2020    Review of Glycemic Control  Diabetes history: DM2 Outpatient Diabetes medications: Novolin 70/30 50 units bid, Invokamet 50/1000 mg QHS Current orders for Inpatient glycemic control: Lantus 20 units QHS, Novolog 0-15 units tidwc + 4 units tidwc  HgbA1C - 8.5% Glucose 171 mg/dL this am Drinking Ensure Max Protein supp  Inpatient Diabetes Program Recommendations:     Agree with orders.  Spoke with pt and wife about his diabetes control at home. Pt states his blood sugars usually run in the 100-200s, although it's been high recently. Follows healthy diet and takes insulin as ordered. Sees Dr Suzette Battiest for Endo. Discussed hypoglycemia, s/s and treatment. Occasionally gets hypo during the night and states he eats cookies. Instructed to drink 1/2 c juice and recheck in 15 min. Appreciative of visit.  Thank you. Lorenda Peck, RD, LDN, CDE Inpatient Diabetes Coordinator [phone]

## 2020-02-10 NOTE — Progress Notes (Signed)
Subjective:  Patient ID: Aaron [name], male    DOB: 1951-04-28,  MRN: [id number]   Objective:  This morning he feels well, much better since he has been on antibiotics. He states the swelling has come down significantly. Denies fevers or chills, glucose has reduced as well (he was not taking insulin at home because he was not hungry). Here with his wife today  Vitals:   02/10/20 0201 02/10/20 0501  BP:  (!) 136/84  Pulse: 65 68  Resp: 15 16  Temp: 99.5 F (37.5 C) 98.4 F (36.9 C)  SpO2: 96% 98%   General AA&O x3. Normal mood and affect.  Vascular  skin is warm.  His capillary refill time is brisk.  Neurologic  reduced sensation to right lower extremity, gross motor function to right lower extremity intact  Dermatologic  dressing in place right foot  Orthopedic:  No pain in right foot today   Basic metabolic panel Order: [id number] Status:  Final result   Visible to patient:  Yes (not seen) Next appt:  02/19/2020 at 03:45 PM in Podiatry (Tausha Milhoan R Malikye Reppond, DPM)  0 Result Notes  Ref Range & Units 04:29 1 d ago 2 d ago  Sodium 135 - 145 mmol/L 136  132 Low   135   Potassium 3.5 - 5.1 mmol/L 3.4 Low   3.8 CM  4.8   Chloride 98 - 111 mmol/L 101  94 Low   98   CO2 22 - 32 mmol/L _0 Glucose, Bld 70 - 99 mg/dL 175 High   335 High  CM  326 High  CM   Comment: Glucose reference range applies only to samples taken after fasting for at least 8 hours.  BUN 8 - 23 mg/dL 26 High   36 High   37 High    Creatinine, Ser 0.61 - 1.24 mg/dL 0.74  1.03  1.06   Calcium 8.9 - 10.3 mg/dL 8.9  9.2  9.5   GFR calc non Af Amer >60 mL/min >60  >60  >60   GFR calc Af Amer >60 mL/min >60  >60  >60   Anion gap 5 - _1 CM  15 CM   Comment: Performed at [facility], [place] [address] [place], [place] [address]  Resulting Agency  [facility] CL[facility] LAB [facility] LAB      Specimen Collected: 02/10/20 04:29 Last Resulted: 02/10/20 05:12       CBC Order:  [id number] Status:  Final result   Visible to patient:  Yes (not seen) Next appt:  02/19/2020 at 03:45 PM in Podiatry (Dolan Xia R Finis Hendricksen, DPM)  0 Result Notes  Ref Range & Units 04:29 1 d ago 2 d ago  WBC 4.0 - 10.5 K/uL 9.0  11.6 High   16.9 High    RBC 4.22 - 5.81 MIL/uL 4.22  4.32  4.69   Hemoglobin 13.0 - 17.0 g/dL 12.2 Low   12.7 Low   13.7   HCT 39 - 52 % 37.8 Low   38.8 Low   42.3   MCV 80.0 - 100.0 fL 89.6  89.8  90.2   MCH 26.0 - 34.0 pg 28.9  29.4  29.2   MCHC 30.0 - 36.0 g/dL 32.3  32.7  32.4   RDW 11.5 - 15.5 % 13.1  12.9  13.2   Platelets 150 - 400 K/uL 271  299  292   nRBC 0.0 -  0.2 % 0.0  0.0  0.0   Comment: Performed at [facility], [place] [address] [place], [place] [address]  Neutrophils Relative %   86 R  90 R   Basophils Absolute   0.0 R  0.0 R   Immature Granulocytes   1 R  1 R   Abs Immature Granulocytes   0.08 High  R, CM  0.10 High  R, CM   Neutro Abs   10.0 High  R  15.3 High  R   Lymphocytes Relative   5 R  3 R   Lymphs Abs   0.6 Low  R  0.5 Low  R   Monocytes Relative   7 R  6 R   Monocytes Absolute   0.8 R  0.9 R   Eosinophils Relative   1 R  0 R   Eosinophils Absolute   0.1 R  0.0 R   Basophils Relative   0 R  0 R   Resulting Agency  [facility] LAB [place] [facility]      Specimen Collected: 02/10/20 04:29 Last Resulted: 02/10/20 04:57       Leukocytosis has resolved.  CRP and ESR have reduced as well.  Blood cultures pending from 02/09/2020  Assessment & Plan:  Patient was evaluated and treated and all questions answered.  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  Discussed the risk, benefits, and potential complications if arise from this.  Also discussed with him that this will likely be a staged operation with planned return to the operating room in 2 to 3 days for further washout and potential delayed primary closure or tertiary closure with negative  pressure wound therapy.  All questions were answered. -Continue broad-spectrum antibiotics.  Wound culture from clinic 02/09/2020 is pending, will obtain further intraoperative cultures today -Proceed OR this afternoon -Continue n.p.o. status -Further medical care per hospitalist team, assistance in admission greatly appreciated. Anticipate we will likely return to OR on Thursday 02/12/20 for further washout and/or closure, will update after procedure today   Criselda Peaches, DPM 02/10/20  Accessible via secure chat, please let me know if you have questions or concerns.

## 2020-02-10 NOTE — Progress Notes (Signed)
Initial Nutrition Assessment  INTERVENTION:   Once diet advanced:  -Ensure MAX Protein po daily, each supplement provides 150 kcal and 30 grams of protein -Juven Fruit Punch BID, each serving provides 95kcal and 2.5g of protein (amino acids glutamine and arginine)  NUTRITION DIAGNOSIS:   Increased nutrient needs related to wound healing as evidenced by estimated needs  GOAL:   Patient will meet greater than or equal to 90% of their needs  MONITOR:   PO intake, Supplement acceptance, Labs, Weight trends, I & O's, Skin  REASON FOR ASSESSMENT:   Consult Wound healing  ASSESSMENT:   69 year old male with past medical history of insulin-dependent type 2 diabetes, hypertension, hyperlipidemia, right foot diabetic ulcer for which he follows podiatry who was doing well until the weekend prior to admission when he began having fevers, chills and night sweats and presented to the ED on 7/25 at which time the ED physician performed an incision and drainage, gave a dose of ceftriaxone and discharged with Augmentin with podiatry follow-up.  Patient presented to his podiatry office on the day of admission with Dr. Sherryle Lis who noted his right foot to be warm with erythema, necrosis and purulent drainage concerning for cellulitis and abscess of his right foot and was a direct admit to [facility] long hospital.  Patient in room with wife and daughter at bedside. Pt's wife reports pt was eating well until Friday 7/23 when he was unable to tolerate anything PO. Pt did consume 100% of dinner last night. Pt states he is very hungry at this time. Currently NPO awaiting surgery on right foot. Pt reports he controls his blood sugars well.  Pt is willing to try supplements once diet is advanced to help with healing.   Per weight records, pt has lost 8 lbs since 2/22 (3% wt loss x 5 months, insignificant for time frame).  Labs reviewed: CBGs: 203 Low K HgbA1c: 8.5  NUTRITION - FOCUSED PHYSICAL  EXAM:  No depletions noted.  Diet Order:   Diet Order            Diet NPO time specified  Diet effective now                 EDUCATION NEEDS:   No education needs have been identified at this time  Skin:  Skin Assessment: Skin Integrity Issues: Skin Integrity Issues:: Diabetic Ulcer Diabetic Ulcer: right foot  Last BM:  7/25  Height:   Ht Readings from Last 1 Encounters:  02/09/20 6\' 2"  (1.88 m)    Weight:   Wt Readings from Last 1 Encounters:  02/09/20 89.3 kg    BMI:  Body mass index is 25.28 kg/m.  Estimated Nutritional Needs:   Kcal:  2200-2400  Protein:  105-115g  Fluid:  2L/day  Clayton Bibles, MS, RD, LDN Inpatient Clinical Dietitian Contact information available via Amion

## 2020-02-10 NOTE — Progress Notes (Signed)
[facility] PROGRESS NOTE   NATALE THOMA ZOX:[id number] DOB: [birth year] DOA: 02/09/2020  PCP: Jacelyn Pi, MD  Brief History/Interval Summary: 69 year old male with past medical history of insulin-dependent type 2 diabetes, hypertension, hyperlipidemia, right foot diabetic ulcer for which he follows podiatry who was doing well until the weekend prior to admission when he began having fevers, chills and night sweats and presented to the ED on 7/25 at which time the ED physician performed an incision and drainage, gave a dose of ceftriaxone and discharged with Augmentin with podiatry follow-up.  Patient presented to his podiatry office on the day of admission with Dr. Sherryle Lis who noted his right foot to be warm with erythema, necrosis and purulent drainage concerning for cellulitis and abscess of his right foot and was a direct admit to [facility] long hospital.  Patient stated he only picked up the Augmentin this a.m. and only got 1 dose of this.  Currently feels well when compared to yesterday without any complaints  Regarding the patient's ED visit on 7/25  leukocytosis at 16.9 thousand with a left shift of absolute neutrophils at 15.3, ESR of 100, temp in ED of 98.1 degrees, plain film x-rays showed subcutaneous gas within the first ray that does not extend into the foot.  Had I&D, ceftriaxone and Augmentin at discharge.  Reason for Visit: Infected right diabetic foot ulcer with cellulitis  Consultants: Podiatry  Procedures: Plan is for surgery to the right foot later today  Antibiotics: Anti-infectives (From admission, onward)   Start     Dose/Rate Route Frequency Ordered Stop   02/09/20 1600  cefTRIAXone (ROCEPHIN) 2 g in sodium chloride 0.9 % 100 mL IVPB     Discontinue     2 g 200 mL/hr over 30 Minutes Intravenous Every 24 hours 02/09/20 1340     02/09/20 1600  metroNIDAZOLE (FLAGYL) IVPB 500 mg     Discontinue     500 mg 100 mL/hr over 60 Minutes Intravenous Every 8  hours 02/09/20 1340     02/09/20 1500  vancomycin (VANCOREADY) IVPB 1250 mg/250 mL     Discontinue     1,250 mg 166.7 mL/hr over 90 Minutes Intravenous 2 times daily 02/09/20 1404        Subjective/Interval History: Patient denies any pain in his right foot.  Waiting on his surgery.  Reports feeling hungry.  Denies any other symptoms.   Assessment/Plan:  Infected right diabetic foot ulcer with surrounding cellulitis Patient remains on broad-spectrum antibiotic coverage with vancomycin ceftriaxone and metronidazole.  Plan is for surgery to the right foot later today.  Podiatry is following.  Monitor labs.  Patient is afebrile.  WBC is 9.0 today.  X-ray of the foot did not show any evidence of osteomyelitis.  Diabetes mellitus type 2, uncontrolled with hyperglycemia CBGs are poorly controlled.  At home patient is noted to be on Invokamet, NPH insulin.  Currently on Lantus.  May need to adjust dose depending on CBGs.  Continue SSI. HbA1c is 8.5.  Essential hypertension Monitor blood pressures closely.  Reasonably well-controlled at this time.  Continue current medications.  Hyperlipidemia Continue statin.  Normocytic anemia Hemoglobin is stable.  Monitor periodically.  No evidence of overt bleeding.  Hypokalemia Will be repleted.  Check magnesium tomorrow.   DVT Prophylaxis: Lovenox Code Status: Full code Family Communication: Discussed with the patient and his wife who was at the bedside. Disposition Plan: Hopefully return home when improved.  Status is: Inpatient  Remains inpatient appropriate because:IV  treatments appropriate due to intensity of illness or inability to take PO and Inpatient level of care appropriate due to severity of illness   Dispo: The patient is from: Home              Anticipated d/c is to: Home              Anticipated d/c date is: 3 days              Patient currently is not medically stable to d/c.    Medications:  Scheduled: . amLODipine  5  mg Oral Daily  . enoxaparin (LOVENOX) injection  40 mg Subcutaneous Q24H  . insulin aspart  0-15 Units Subcutaneous TID WC  . insulin aspart  4 Units Subcutaneous TID WC  . insulin glargine  20 Units Subcutaneous QHS  . pregabalin  50 mg Oral QHS  . simvastatin  20 mg Oral QHS   Continuous: . sodium chloride    . cefTRIAXone (ROCEPHIN)  IV 2 g (02/09/20 1856)  . metronidazole 500 mg (02/10/20 0834)  . vancomycin 1,250 mg (02/10/20 0508)   NVB:TYOMAY chloride, acetaminophen **OR** acetaminophen, hydrALAZINE, HYDROcodone-acetaminophen, morphine injection   Objective:  Vital Signs  Vitals:   02/09/20 1417 02/09/20 2153 02/10/20 0201 02/10/20 0501  BP:  (!) 141/78  (!) 136/84  Pulse:  76 65 68  Resp:  _0 Temp:  98.8 F (37.1 C) 99.5 F (37.5 C) 98.4 F (36.9 C)  TempSrc:  Oral Oral Oral  SpO2:  98% 96% 98%  Weight: 89.3 kg     Height: _1  (1.88 m)       Intake/Output Summary (Last 24 hours) at 02/10/2020 1213 Last data filed at 02/10/2020 0907 Gross per 24 hour  Intake 1038.19 ml  Output 0 ml  Net 1038.19 ml   Filed Weights   02/09/20 1417  Weight: 89.3 kg    General appearance: Awake alert.  In no distress Resp: Clear to auscultation bilaterally.  Normal effort Cardio: S1-S2 is normal regular.  No S3-S4.  No rubs murmurs or bruit GI: Abdomen is soft.  Nontender nondistended.  Bowel sounds are present normal.  No masses organomegaly Extremities: Right foot covered in dressing. Neurologic: Alert and oriented x3.  No focal neurological deficits.    Lab Results:  Data Reviewed: I have personally reviewed following labs and imaging studies  CBC: Recent Labs  Lab 02/08/20 1439 02/09/20 1409 02/10/20 0429  WBC 16.9* 11.6* 9.0  NEUTROABS 15.3* 10.0*  --   HGB 13.7 12.7* 12.2*  HCT 42.3 38.8* 37.8*  MCV 90.2 89.8 89.6  PLT 292 299 045    Basic Metabolic Panel: Recent Labs  Lab 02/08/20 1439 02/09/20 1409 02/10/20 0429  NA 135 132* 136  K 4.8  3.8 3.4*  CL 98 94* 101  CO2 _2 GLUCOSE 326* 335* 175*  BUN 37* 36* 26*  CREATININE 1.06 1.03 0.74  CALCIUM 9.5 9.2 8.9    GFR: Estimated Creatinine Clearance: 99.9 mL/min (by C-G formula based on SCr of 0.74 mg/dL).  HbA1C: Recent Labs    02/09/20 1409  HGBA1C 8.5*    CBG: Recent Labs  Lab 02/09/20 1631 02/09/20 1941 02/10/20 0726  GLUCAP 267* 224* 203*      Recent Results (from the past 240 hour(s))  SARS Coronavirus 2 by RT PCR (hospital order, performed in [facility] hospital lab) Nasopharyngeal Nasopharyngeal Swab     Status: None   Collection Time:  02/09/20  1:37 PM   Specimen: Nasopharyngeal Swab  Result Value Ref Range Status   SARS Coronavirus 2 NEGATIVE NEGATIVE Final    Comment: (NOTE) SARS-CoV-2 target nucleic acids are NOT DETECTED.  The SARS-CoV-2 RNA is generally detectable in upper and lower respiratory specimens during the acute phase of infection. The lowest concentration of SARS-CoV-2 viral copies this assay can detect is 250 copies / mL. A negative result does not preclude SARS-CoV-2 infection and should not be used as the sole basis for treatment or other patient management decisions.  A negative result may occur with improper specimen collection / handling, submission of specimen other than nasopharyngeal swab, presence of viral mutation(s) within the areas targeted by this assay, and inadequate number of viral copies (<250 copies / mL). A negative result must be combined with clinical observations, patient history, and epidemiological information.  Fact Sheet for Patients:   StrictlyIdeas.no  Fact Sheet for Healthcare Providers: BankingDealers.co.za  This test is not yet approved or  cleared by the [place] FDA and has been authorized for detection and/or diagnosis of SARS-CoV-2 by FDA under an Emergency Use Authorization (EUA).  This EUA will remain in effect (meaning this  test can be used) for the duration of the COVID-19 declaration under Section 564(b)(1) of the Act, 21 U.S.C. section 360bbb-3(b)(1), unless the authorization is terminated or revoked sooner.  Performed at [facility], [place] [address] [place], [place] [address]   Blood Cultures x 2 sites     Status: None (Preliminary result)   Collection Time: 02/09/20  2:09 PM   Specimen: BLOOD  Result Value Ref Range Status   Specimen Description   Final    BLOOD RIGHT ANTECUBITAL Performed at [facility], [place] [address] [place], [place] [address]    Special Requests   Final    BOTTLES DRAWN AEROBIC ONLY Blood Culture adequate volume Performed at [place] [address] [place], [place] [address]    Culture PENDING  Incomplete   Report Status PENDING  Incomplete  Blood Cultures x 2 sites     Status: None (Preliminary result)   Collection Time: 02/09/20  2:09 PM   Specimen: BLOOD  Result Value Ref Range Status   Specimen Description   Final    BLOOD LEFT ANTECUBITAL Performed at [facility], [place] [address] [place], [place] [address]    Special Requests   Final    BOTTLES DRAWN AEROBIC ONLY Blood Culture adequate volume Performed at [place] [address] [place], [place] [address]    Culture PENDING  Incomplete   Report Status PENDING  Incomplete  Surgical pcr screen     Status: None   Collection Time: 02/10/20  5:29 AM   Specimen: Nasal Mucosa; Nasal Swab  Result Value Ref Range Status   MRSA, PCR NEGATIVE NEGATIVE Final   Staphylococcus aureus NEGATIVE NEGATIVE Final    Comment: (NOTE) The Xpert SA Assay (FDA approved for NASAL specimens in patients 24 years of age and older), is one component of a comprehensive surveillance program. It is not intended to diagnose infection nor to guide or monitor treatment. Performed at [facility], [place] [address] [place], [place] [address]         Radiology Studies: DG Foot 2 Views Right  Result Date: 02/08/2020 CLINICAL DATA:  Diabetic ulceration, foot swelling EXAM: RIGHT FOOT - 2 VIEW COMPARISON:  01/26/2020 FINDINGS: Extensive soft tissue swelling involving  the right forefoot has progressed since prior examination. There has also, now all, developed subcutaneous gas between the first and second digits within the interspace no associated rows of changes within the adjacent osseous structures to suggest osteomyelitis. Well-circumscribed lytic lesion within the proximal phalanx of the great toe may represent a a unicameral bone cyst or phalangeal and chondroma. 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 Mild midfoot degenerative arthritis. Moderate plantar calcaneal spur. Vascular calcifications are noted within the soft tissues. IMPRESSION: Interval development of plantar wound with extensive soft tissue swelling involving the right forefoot and subcutaneous gas in keeping with aggressive infection. No associated osseous erosive changes. Hallux rigidus Electronically Signed   By: Fidela Salisbury MD   On: 02/08/2020 15:01       LOS: 1 day   [facility] Pager on [email or web address]  02/10/2020, 12:13 PM

## 2020-02-11 ENCOUNTER — Encounter (HOSPITAL_COMMUNITY): Payer: Self-pay | Admitting: Podiatry

## 2020-02-11 DIAGNOSIS — L97411 Non-pressure chronic ulcer of right heel and midfoot limited to breakdown of skin: Secondary | ICD-10-CM

## 2020-02-11 LAB — CBC
HCT: 33.7 % — ABNORMAL LOW (ref 39.0–52.0)
Hemoglobin: 11.4 g/dL — ABNORMAL LOW (ref 13.0–17.0)
MCH: 29.8 pg (ref 26.0–34.0)
MCHC: 33.8 g/dL (ref 30.0–36.0)
MCV: 88 fL (ref 80.0–100.0)
Platelets: 254 10*3/uL (ref 150–400)
RBC: 3.83 MIL/uL — ABNORMAL LOW (ref 4.22–5.81)
RDW: 12.9 % (ref 11.5–15.5)
WBC: 8.6 10*3/uL (ref 4.0–10.5)
nRBC: 0 % (ref 0.0–0.2)

## 2020-02-11 LAB — BASIC METABOLIC PANEL
Anion gap: 10 (ref 5–15)
BUN: 21 mg/dL (ref 8–23)
CO2: 22 mmol/L (ref 22–32)
Calcium: 8.6 mg/dL — ABNORMAL LOW (ref 8.9–10.3)
Chloride: 103 mmol/L (ref 98–111)
Creatinine, Ser: 0.73 mg/dL (ref 0.61–1.24)
GFR calc Af Amer: >60 mL/min (ref >60)
GFR calc non Af Amer: >60 mL/min (ref >60)
Glucose, Bld: 171 mg/dL — ABNORMAL HIGH (ref 70–99)
Potassium: 3.8 mmol/L (ref 3.5–5.1)
Sodium: 135 mmol/L (ref 135–145)

## 2020-02-11 LAB — MAGNESIUM: Magnesium: 2.2 mg/dL (ref 1.7–2.4)

## 2020-02-11 MED ORDER — CHLORHEXIDINE GLUCONATE CLOTH 2 % EX PADS: 6.0000 | MEDICATED_PAD | Freq: Once | CUTANEOUS | Status: AC

## 2020-02-11 MED ORDER — CHLORHEXIDINE GLUCONATE CLOTH 2 % EX PADS
6.0000 | MEDICATED_PAD | Freq: Once | CUTANEOUS | Status: AC
  Administered 2020-02-11 – 2020-02-12 (×2): 6 via TOPICAL

## 2020-02-11 MED ORDER — ENSURE MAX PROTEIN PO LIQD
11.0000 [oz_av] | Freq: Every day | ORAL | Status: DC
  Administered 2020-02-11 – 2020-02-14 (×3): 11 [oz_av] via ORAL

## 2020-02-11 MED ORDER — JUVEN PO PACK
1.0000 | PACK | Freq: Two times a day (BID) | ORAL | Status: DC
  Administered 2020-02-11 – 2020-02-14 (×8): 1 via ORAL
  Filled 2020-02-11 (×10): qty 1

## 2020-02-11 NOTE — Progress Notes (Signed)
°  Subjective:  Patient ID: Aaron [name], male    DOB: January 30, 1951,  MRN: [id number]  Feeling better today.  His appetite has returned.  Is beginning to have some loose bowel movements.  No pain in the foot.  His wife is not currently here during my visit.  Negative for chest pain and shortness of breath Fever: no Night sweats: no Constitutional signs: yes, he is beginning to have loose stools Review of all other systems is negative Objective:   Vitals:   02/11/20 1348 02/11/20 2105  BP: 127/79 (!) 156/77  Pulse: 75 73  Resp: 17 16  Temp: 98.1 F (36.7 C) 98.3 F (36.8 C)  SpO2: 98% 96%   General AA&O x3. Normal mood and affect.  Vascular Dorsalis pedis and posterior tibial pulses 2/4 bilat. Brisk capillary refill to all digits. Pedal hair present.  Neurologic Epicritic sensation grossly absent.  Dermatologic  cellulitis improved.  Wound bed largely granular.  No purulence noted on exam.  No malodor.  Orthopedic:  Hallux rigidus present      Assessment & Plan:  Patient was evaluated and treated and all questions answered.   -Continue broad-spectrum antibiotics for now.  Can narrow this as soon as cultures from OR yesterday speciate -Nonweightbearing right lower extremity.  Okay to place weight on the heel for transfers -Plan for return to the OR tomorrow.  N.p.o. past midnight.  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  Risks, benefits, and potential complications and postoperative course were discussed.  All questions were  addressed  Criselda Peaches, DPM  Accessible via secure chat for questions or concerns.

## 2020-02-11 NOTE — Plan of Care (Signed)
Plan of care reviewed and discussed with the patient. 

## 2020-02-11 NOTE — Progress Notes (Signed)
[facility]                                                                              Patient Demographics  Aaron [name], is a 69 y.o. male, DOB - 23-Oct-1950, GNF:[id number]  Admit date - 02/09/2020   Admitting Physician Harold Hedge, MD  Outpatient Primary MD for the patient is Jacelyn Pi, MD  Outpatient specialists:   LOS - 2  days   Medical records reviewed and are as summarized below:    No chief complaint on file.      Brief summary   Patient is a 69 year old male with past medical history of insulin-dependent type 2 diabetes, hypertension, hyperlipidemia, right foot diabetic ulcer for which he follows podiatry who was doing well until the weekend prior to admission when he began having fevers, chills and night sweats and presented to the ED on 7/25 at which time the ED physician performed an incision and drainage, gave a dose of ceftriaxone and discharged with Augmentin with podiatry follow-up. Patient presented to his podiatry office on the day of admission with Dr. Sherryle Lis who noted his right foot to be warm with erythema, necrosis and purulent drainage concerning for cellulitis and abscess of his right foot and was a direct admit to [facility]. Patient stated he only picked up the Augmentin on the morning of admission and only got 1 dose of this.     Assessment & Plan    Principal Problem:   Diabetic ulcer of right foot associated with type 2 diabetes mellitus ([place]), infected with surrounding cellulitis, abscess -Patient was placed on broad spectrum antibiotics with vancomycin, ceftriaxone and metronidazole -Podiatry consulted, patient underwent incision and drainage of the abscess and deep space infection below fascia, postop day #1 -will continue current management, per podiatry, likely repeat I&D in 2 to 3 days -Afebrile, no leukocytosis, follow cultures  Active Problems: Diabetes mellitus type 2, uncontrolled with  complications/diabetic ulcer, hyperglycemia -CBGs poorly controlled, likely due to illness and surgery, at home on invokamet, NPH insulin -Continue Lantus, sliding scale insulin -HbA1c 8.5  Essential hypertension -Controlled, continue amlodipine  Hyperlipidemia Continue statin.  Normocytic anemia H&H currently stable, and no overt bleeding  Hypokalemia Resolved, magnesium 2.2    Code Status: Full code DVT Prophylaxis:  Lovenox  Family Communication: Discussed all imaging results, lab results, explained to the patient, wife at the bedside   Disposition Plan:     Status is: Inpatient  Remains inpatient appropriate because:Inpatient level of care appropriate due to severity of illness   Dispo:  Patient From: Home  Planned Disposition: Home  Expected discharge date: 02/13/20  Medically stable for discharge: No       Time Spent in minutes   35 minutes  Procedures:  None  Consultants:   Podiatry  Antimicrobials:   Anti-infectives (From admission, onward)   Start     Dose/Rate Route Frequency Ordered Stop   02/09/20 1600  cefTRIAXone (ROCEPHIN) 2 g in sodium chloride 0.9 % 100 mL IVPB     Discontinue     2 g 200 mL/hr over 30 Minutes Intravenous Every 24 hours 02/09/20  1340     02/09/20 1600  metroNIDAZOLE (FLAGYL) IVPB 500 mg     Discontinue     500 mg 100 mL/hr over 60 Minutes Intravenous Every 8 hours 02/09/20 1340     02/09/20 1500  vancomycin (VANCOREADY) IVPB 1250 mg/250 mL     Discontinue     1,250 mg 166.7 mL/hr over 90 Minutes Intravenous 2 times daily 02/09/20 1404            Medications  Scheduled Meds: . amLODipine  5 mg Oral Daily  . enoxaparin (LOVENOX) injection  40 mg Subcutaneous Q24H  . insulin aspart  0-15 Units Subcutaneous TID WC  . insulin aspart  4 Units Subcutaneous TID WC  . insulin glargine  20 Units Subcutaneous QHS  . nutrition supplement (JUVEN)  1 packet Oral BID BM  . pregabalin  50 mg Oral QHS  . Ensure Max  Protein  11 oz Oral Daily  . simvastatin  20 mg Oral QHS   Continuous Infusions: . sodium chloride    . cefTRIAXone (ROCEPHIN)  IV 2 g (02/09/20 1856)  . metronidazole 500 mg (02/11/20 7858)  . vancomycin 1,250 mg (02/11/20 0520)   PRN Meds:.sodium chloride, acetaminophen **OR** acetaminophen, hydrALAZINE, HYDROcodone-acetaminophen, morphine injection      Subjective:   [name] was seen and examined today.  Pain in the right foot controlled, no acute complaints. Patient denies dizziness, chest pain, shortness of breath, abdominal pain, nausea or vomiting, new weakness, numbess, tingling. No acute events overnight.    Objective:   Vitals:   02/10/20 2131 02/11/20 0117 02/11/20 0639 02/11/20 1348  BP: (!) 136/64 (!) 152/78 (!) 146/77 127/79  Pulse: 74 73 68 75  Resp: 18 18 18 17   Temp: 98.7 F (37.1 C) 98.9 F (37.2 C) 97.7 F (36.5 C) 98.1 F (36.7 C)  TempSrc: Oral Oral Oral   SpO2: 100% 99% 99% 98%  Weight:      Height:        Intake/Output Summary (Last 24 hours) at 02/11/2020 1523 Last data filed at 02/11/2020 0600 Gross per 24 hour  Intake 1434.5 ml  Output 1250 ml  Net 184.5 ml     Wt Readings from Last 3 Encounters:  02/09/20 89.3 kg  09/08/19 92.9 kg     Exam  General: Alert and oriented x 3, NAD  Cardiovascular: S1 S2 auscultated, no murmurs, RRR  Respiratory: Clear to auscultation bilaterally, no wheezing, rales or rhonchi  Gastrointestinal: Soft, nontender, nondistended, + bowel sounds  Ext: no pedal edema left, right foot dressing intact  Neuro: no new deficits  Musculoskeletal: No digital cyanosis, clubbing  Skin: Right foot dressing intact  Psych: Normal affect and demeanor, alert and oriented x3    Data Reviewed:  I have personally reviewed following labs and imaging studies  Micro Results Recent Results (from the past 240 hour(s))  SARS Coronavirus 2 by RT PCR (hospital order, performed in [place] hospital lab)  Nasopharyngeal Nasopharyngeal Swab     Status: None   Collection Time: 02/09/20  1:37 PM   Specimen: Nasopharyngeal Swab  Result Value Ref Range Status   SARS Coronavirus 2 NEGATIVE NEGATIVE Final    Comment: (NOTE) SARS-CoV-2 target nucleic acids are NOT DETECTED.  The SARS-CoV-2 RNA is generally detectable in upper and lower respiratory specimens during the acute phase of infection. The lowest concentration of SARS-CoV-2 viral copies this assay can detect is 250 copies / mL. A negative result does not preclude SARS-CoV-2 infection  and should not be used as the sole basis for treatment or other patient management decisions.  A negative result may occur with improper specimen collection / handling, submission of specimen other than nasopharyngeal swab, presence of viral mutation(s) within the areas targeted by this assay, and inadequate number of viral copies (<250 copies / mL). A negative result must be combined with clinical observations, patient history, and epidemiological information.  Fact Sheet for Patients:   StrictlyIdeas.no  Fact Sheet for Healthcare Providers: BankingDealers.co.za  This test is not yet approved or  cleared by the [place] FDA and has been authorized for detection and/or diagnosis of SARS-CoV-2 by FDA under an Emergency Use Authorization (EUA).  This EUA will remain in effect (meaning this test can be used) for the duration of the COVID-19 declaration under Section 564(b)(1) of the Act, 21 U.S.C. section 360bbb-3(b)(1), unless the authorization is terminated or revoked sooner.  Performed at [facility], [place] [address], [place], [place] [address]   Blood Cultures x 2 sites     Status: None (Preliminary result)   Collection Time: 02/09/20  2:09 PM   Specimen: BLOOD  Result Value Ref Range Status   Specimen Description   Final    BLOOD RIGHT ANTECUBITAL Performed at [facility], [place] [address], [place], [place] [address]    Special Requests   Final    BOTTLES DRAWN AEROBIC ONLY Blood Culture adequate volume Performed at [place] [address], [place], [place] [address]    Culture   Final    NO GROWTH < 24 HOURS Performed at [place] [address], [place], [place] [address]    Report Status PENDING  Incomplete  Blood Cultures x 2 sites     Status: None (Preliminary result)   Collection Time: 02/09/20  2:09 PM   Specimen: BLOOD  Result Value Ref Range Status   Specimen Description   Final    BLOOD LEFT ANTECUBITAL Performed at [facility], [place] [address] [place], [place] [address]    Special Requests   Final    BOTTLES DRAWN AEROBIC ONLY Blood Culture adequate volume Performed at [place] [address] [place], [place] [address]    Culture   Final    NO GROWTH < 24 HOURS Performed at [place] [address], [place], [place] [address]    Report Status PENDING  Incomplete  Surgical pcr screen     Status: None   Collection Time: 02/10/20  5:29 AM   Specimen: Nasal Mucosa; Nasal Swab  Result Value Ref Range Status   MRSA, PCR NEGATIVE NEGATIVE Final   Staphylococcus aureus NEGATIVE NEGATIVE Final    Comment: (NOTE) The Xpert SA Assay (FDA approved for NASAL specimens in patients 68 years of age and older), is one component of a comprehensive surveillance program. It is not intended to diagnose infection nor to guide or monitor treatment. Performed at [facility], [place] [address] [place], [place] [address]   Aerobic/Anaerobic Culture (surgical/deep wound)     Status: None (Preliminary result)   Collection Time: 02/10/20  3:48 PM   Specimen: Wound  Result Value Ref Range Status   Specimen Description   Final    WOUND Performed at [place] [address] [place], [place] [address]    Special Requests DEEP WOUND  CULTURE OF RIGHT FOOT  Final   Gram Stain  Final    RARE WBC PRESENT, PREDOMINANTLY PMN ABUNDANT GRAM NEGATIVE RODS MODERATE GRAM POSITIVE COCCI IN PAIRS IN CLUSTERS    Culture   Final    CULTURE REINCUBATED FOR BETTER GROWTH Performed at [facility], [place] [address] [place], [place] [address]    Report Status PENDING  Incomplete    Radiology Reports MR FOOT RIGHT W WO CONTRAST  Result Date: 01/30/2020 CLINICAL DATA:  Diabetic foot ulcer with nonhealing blister along the plantar aspect of the 1st MTP joint for 3 weeks. EXAM: MRI OF THE RIGHT FOREFOOT WITHOUT AND WITH CONTRAST TECHNIQUE: Multiplanar, multisequence MR imaging of the right forefoot was performed before and after the administration of intravenous contrast. CONTRAST:  12mL MULTIHANCE GADOBENATE DIMEGLUMINE 529 MG/ML IV SOLN COMPARISON:  Radiographs 01/26/2020 FINDINGS: Bones/Joint/Cartilage There is advanced arthropathy at the 1st metatarsophalangeal joint with advanced joint space narrowing, osteophytes and subchondral cyst formation, especially in the base of the proximal phalanx. There is relatively mild involvement of the sesamoids. No bone destruction, marrow edema or suspicious enhancement. No significant joint effusion. The additional metatarsophalangeal and interphalangeal joints appear normal. There are mild degenerative changes at the Lisfranc joint. Ligaments Intact Lisfranc ligament. Muscles and Tendons The forefoot tendons appear intact without significant tenosynovitis. There is mild nodular thickening of the central cord of the plantar fascia distally which may reflect mild plantar fibromatosis or inflammation. 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 No suspicious muscular enhancement. Soft tissues There is soft tissue ulceration along the plantar aspect of the medial forefoot at the level of the 1st MTP joint. There are underlying inflammatory changes and heterogeneous  enhancement in the underlying subcutaneous fat, but no focal fluid collection or foreign body. IMPRESSION: 1. Soft tissue ulceration along the plantar aspect of the medial forefoot at the level of the 1st MTP joint with underlying inflammatory changes and heterogeneous enhancement in the underlying subcutaneous fat. No evidence of abscess or foreign body. 2. No evidence of osteomyelitis. 3. 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 4. 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 Electronically Signed   By: Richardean Sale M.D.   On: 01/30/2020 11:33   DG Foot 2 Views Right  Result Date: 02/08/2020 CLINICAL DATA:  Diabetic ulceration, foot swelling EXAM: RIGHT FOOT - 2 VIEW COMPARISON:  01/26/2020 FINDINGS: Extensive soft tissue swelling involving the right forefoot has progressed since prior examination. There has also, now all, developed subcutaneous gas between the first and second digits within the interspace no associated rows of changes within the adjacent osseous structures to suggest osteomyelitis. Well-circumscribed lytic lesion within the proximal phalanx of the great toe may represent a a unicameral bone cyst or phalangeal and chondroma. 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 Mild midfoot degenerative arthritis. Moderate plantar calcaneal spur. Vascular calcifications are noted within the soft tissues. IMPRESSION: Interval development of plantar wound with extensive soft tissue swelling involving the right forefoot and subcutaneous gas in keeping with aggressive infection. No associated osseous erosive changes. Hallux rigidus Electronically Signed   By: Fidela Salisbury MD   On: 02/08/2020 15:01   DG Foot Complete Right  Result Date: 02/02/2020 Please see detailed radiograph report in office note.   Lab Data:  CBC: Recent Labs  Lab 02/08/20 1439 02/09/20 1409 02/10/20 0429  02/11/20 0514  WBC 16.9* 11.6* 9.0 8.6  NEUTROABS 15.3* 10.0*  --   --   HGB 13.7  12.7* 12.2* 11.4*  HCT 42.3 38.8* 37.8* 33.7*  MCV 90.2 89.8 89.6 88.0  PLT 292 299 271 542   Basic Metabolic Panel: Recent Labs  Lab 02/08/20 1439 02/09/20 1409 02/10/20 0429 02/11/20 0514  NA 135 132* 136 135  K 4.8 3.8 3.4* 3.8  CL 98 94* 101 103  CO2 22 24 25 22   GLUCOSE 326* 335* 175* 171*  BUN 37* 36* 26* 21  CREATININE 1.06 1.03 0.74 0.73  CALCIUM 9.5 9.2 8.9 8.6*  MG  --   --   --  2.2   GFR: Estimated Creatinine Clearance: 99.9 mL/min (by C-G formula based on SCr of 0.73 mg/dL). Liver Function Tests: No results for input(s): AST, ALT, ALKPHOS, BILITOT, PROT, ALBUMIN in the last 168 hours. No results for input(s): LIPASE, AMYLASE in the last 168 hours. No results for input(s): AMMONIA in the last 168 hours. Coagulation Profile: No results for input(s): INR, PROTIME in the last 168 hours. Cardiac Enzymes: No results for input(s): CKTOTAL, CKMB, CKMBINDEX, TROPONINI in the last 168 hours. BNP (last 3 results) No results for input(s): PROBNP in the last 8760 hours. HbA1C: Recent Labs    02/09/20 1409  HGBA1C 8.5*   CBG: Recent Labs  Lab 02/09/20 1631 02/09/20 1941 02/10/20 0726 02/10/20 1643  GLUCAP 267* 224* 203* 135*   Lipid Profile: No results for input(s): CHOL, HDL, LDLCALC, TRIG, CHOLHDL, LDLDIRECT in the last 72 hours. Thyroid Function Tests: No results for input(s): TSH, T4TOTAL, FREET4, T3FREE, THYROIDAB in the last 72 hours. Anemia Panel: No results for input(s): VITAMINB12, FOLATE, FERRITIN, TIBC, IRON, RETICCTPCT in the last 72 hours. Urine analysis:    Component Value Date/Time   COLORURINE YELLOW 02/18/2010 2152   APPEARANCEUR CLEAR 02/18/2010 2152   LABSPEC 1.031 (H) 02/18/2010 2152   PHURINE 6.5 02/18/2010 2152   GLUCOSEU >1000 (A) 02/18/2010 2152   HGBUR NEGATIVE 02/18/2010 2152   BILIRUBINUR negative 06/13/2019 1142   KETONESUR trace (5) (A)  06/13/2019 1142   KETONESUR NEGATIVE 02/18/2010 2152   PROTEINUR =100 (A) 06/13/2019 1142   PROTEINUR NEGATIVE 02/18/2010 2152   UROBILINOGEN 1.0 06/13/2019 1142   UROBILINOGEN 1.0 02/18/2010 2152   NITRITE Negative 06/13/2019 1142   NITRITE NEGATIVE 02/18/2010 2152   LEUKOCYTESUR Negative 06/13/2019 1142     Francis Doenges M.D. [facility] 02/11/2020, 3:23 PM   Call night coverage person covering after 7pm

## 2020-02-11 NOTE — Consult Note (Signed)
PV Navigator Consult acknowledged and chart reviewed along with media photos. Working from remote location.  69 y/o gentleman  status post I&D of R foot abscess in the ED on 02/08/20 where he was placed on antibiotics and discharged home with wife. He presented to Podiatry appt for follow up on 02/09/20 with worsening symptoms of fever, chills and night sweats and was made a direct admit to [facility]. Second I&D with washout performed 02/10/20 with podiatry and per notes, patient will require sequential washouts later this week with application of a wound vac.  Medical history to include Insulin dependent DM-2, HTN, HLD, and chronic diabetic ulcer R foot. Patient is a former smoker.  Consulted disciplines include: WOC (not following at current time), Nutrition, Diabetes Coordinator and TOC team.   Suggested VAS [place] ABI and arterial duplex studies to determine bloodflow  with Dr Sherryle Lis via [place]. Orders in place.   No other barriers identified at this time. I will continue to follow.  Thank you, Cletis Media RN BSN CWS [place] [phone]

## 2020-02-12 ENCOUNTER — Encounter (HOSPITAL_COMMUNITY)
Admission: AD | Disposition: A | Discharge status: Home / Self Care | Payer: Self-pay | Source: Ambulatory Visit | Attending: Internal Medicine

## 2020-02-12 ENCOUNTER — Telehealth: Payer: Self-pay | Admitting: Podiatry

## 2020-02-12 ENCOUNTER — Inpatient Hospital Stay (HOSPITAL_COMMUNITY): Payer: Medicare Other | Admitting: Anesthesiology

## 2020-02-12 ENCOUNTER — Encounter (HOSPITAL_COMMUNITY): Payer: Self-pay | Admitting: Internal Medicine

## 2020-02-12 HISTORY — PX: SESAMOIDECTOMY: SHX6418

## 2020-02-12 HISTORY — PX: APPLICATION OF WOUND VAC: SHX5189

## 2020-02-12 HISTORY — PX: IRRIGATION AND DEBRIDEMENT FOOT: SHX6602

## 2020-02-12 LAB — CBC
HCT: 33.7 % — ABNORMAL LOW (ref 39.0–52.0)
Hemoglobin: 11.2 g/dL — ABNORMAL LOW (ref 13.0–17.0)
MCH: 29.4 pg (ref 26.0–34.0)
MCHC: 33.2 g/dL (ref 30.0–36.0)
MCV: 88.5 fL (ref 80.0–100.0)
Platelets: 245 10*3/uL (ref 150–400)
RBC: 3.81 MIL/uL — ABNORMAL LOW (ref 4.22–5.81)
RDW: 12.7 % (ref 11.5–15.5)
WBC: 8.5 10*3/uL (ref 4.0–10.5)
nRBC: 0 % (ref 0.0–0.2)

## 2020-02-12 LAB — BASIC METABOLIC PANEL
Anion gap: 9 (ref 5–15)
BUN: 18 mg/dL (ref 8–23)
CO2: 22 mmol/L (ref 22–32)
Calcium: 8.7 mg/dL — ABNORMAL LOW (ref 8.9–10.3)
Chloride: 103 mmol/L (ref 98–111)
Creatinine, Ser: 0.61 mg/dL (ref 0.61–1.24)
GFR calc Af Amer: >60 mL/min (ref >60)
GFR calc non Af Amer: >60 mL/min (ref >60)
Glucose, Bld: 160 mg/dL — ABNORMAL HIGH (ref 70–99)
Potassium: 3.8 mmol/L (ref 3.5–5.1)
Sodium: 134 mmol/L — ABNORMAL LOW (ref 135–145)

## 2020-02-12 LAB — GLUCOSE, CAPILLARY
Glucose-Capillary: 194 mg/dL — ABNORMAL HIGH (ref 70–99)
Glucose-Capillary: 148 mg/dL — ABNORMAL HIGH (ref 70–99)

## 2020-02-12 SURGERY — IRRIGATION AND DEBRIDEMENT FOOT
Anesthesia: Monitor Anesthesia Care | Site: Foot | Laterality: Right

## 2020-02-12 MED ORDER — LIDOCAINE 2% (20 MG/ML) 5 ML SYRINGE
INTRAMUSCULAR | Status: DC | PRN
  Administered 2020-02-12: 100 mg via INTRAVENOUS

## 2020-02-12 MED ORDER — OXYCODONE HCL 5 MG/5ML PO SOLN: 5.0000 mg | Freq: Once | ORAL | Status: DC | PRN

## 2020-02-12 MED ORDER — BUPIVACAINE HCL (PF) 0.5 % IJ SOLN
INTRAMUSCULAR | Status: AC
  Filled 2020-02-12: qty 30

## 2020-02-12 MED ORDER — FENTANYL CITRATE (PF) 100 MCG/2ML IJ SOLN: 25.0000 ug | INTRAMUSCULAR | Status: DC | PRN

## 2020-02-12 MED ORDER — OXYCODONE HCL 5 MG PO TABS: 5.0000 mg | ORAL_TABLET | Freq: Once | ORAL | Status: DC | PRN

## 2020-02-12 MED ORDER — PROPOFOL 10 MG/ML IV BOLUS
INTRAVENOUS | Status: AC
  Filled 2020-02-12: qty 20

## 2020-02-12 MED ORDER — PROPOFOL 500 MG/50ML IV EMUL
INTRAVENOUS | Status: DC | PRN
  Administered 2020-02-12: 75 ug/kg/min via INTRAVENOUS

## 2020-02-12 MED ORDER — BUPIVACAINE HCL (PF) 0.5 % IJ SOLN
INTRAMUSCULAR | Status: DC | PRN
  Administered 2020-02-12: 10 mL

## 2020-02-12 MED ORDER — FENTANYL CITRATE (PF) 100 MCG/2ML IJ SOLN
INTRAMUSCULAR | Status: AC
  Filled 2020-02-12: qty 2

## 2020-02-12 MED ORDER — SODIUM CHLORIDE 0.9 % IV SOLN: INTRAVENOUS | Status: AC

## 2020-02-12 MED ORDER — PROPOFOL 500 MG/50ML IV EMUL
INTRAVENOUS | Status: AC
  Filled 2020-02-12: qty 50

## 2020-02-12 MED ORDER — ONDANSETRON HCL 4 MG/2ML IJ SOLN
INTRAMUSCULAR | Status: DC | PRN
Start: 2020-02-12 — End: 2020-02-12
  Administered 2020-02-12: 4 mg via INTRAVENOUS

## 2020-02-12 MED ORDER — LOPERAMIDE HCL 2 MG PO CAPS: 2.0000 mg | ORAL_CAPSULE | Freq: Three times a day (TID) | ORAL | Status: DC | PRN

## 2020-02-12 MED ORDER — ONDANSETRON HCL 4 MG/2ML IJ SOLN: 4.0000 mg | Freq: Once | INTRAMUSCULAR | Status: DC | PRN

## 2020-02-12 MED ORDER — VANCOMYCIN HCL 1000 MG IV SOLR
INTRAVENOUS | Status: AC
  Filled 2020-02-12: qty 1000

## 2020-02-12 MED ORDER — SODIUM CHLORIDE 0.9 % IR SOLN
Status: DC | PRN
  Administered 2020-02-12: 3000 mL

## 2020-02-12 MED ORDER — FENTANYL CITRATE (PF) 100 MCG/2ML IJ SOLN
INTRAMUSCULAR | Status: DC | PRN
  Administered 2020-02-12: 25 ug via INTRAVENOUS

## 2020-02-12 MED ORDER — LIDOCAINE 2% (20 MG/ML) 5 ML SYRINGE
INTRAMUSCULAR | Status: AC
  Filled 2020-02-12: qty 5

## 2020-02-12 MED ORDER — PROPOFOL 10 MG/ML IV BOLUS
INTRAVENOUS | Status: DC | PRN
  Administered 2020-02-12 (×2): 20 mg via INTRAVENOUS

## 2020-02-12 MED ORDER — CHLORHEXIDINE GLUCONATE 0.12 % MT SOLN
15.0000 mL | Freq: Once | OROMUCOSAL | Status: AC
  Administered 2020-02-12: 15 mL via OROMUCOSAL

## 2020-02-12 MED ORDER — ONDANSETRON HCL 4 MG/2ML IJ SOLN
INTRAMUSCULAR | Status: AC
  Filled 2020-02-12: qty 2

## 2020-02-12 SURGICAL SUPPLY — 68 items
BLADE HEX COATED 2.75 (ELECTRODE) IMPLANT
BLADE OSCILLATING/SAGITTAL (BLADE)
BLADE SURG 15 STRL LF DISP TIS (BLADE) ×1 IMPLANT
BLADE SURG 15 STRL SS (BLADE) ×3
BLADE SW THK.38XMED LNG THN (BLADE) IMPLANT
BNDG ELASTIC 3X5.8 VLCR STR LF (GAUZE/BANDAGES/DRESSINGS) ×3 IMPLANT
BNDG ELASTIC 4X5.8 VLCR STR LF (GAUZE/BANDAGES/DRESSINGS) ×3 IMPLANT
BNDG ESMARK 4X9 LF (GAUZE/BANDAGES/DRESSINGS) IMPLANT
BNDG GAUZE ELAST 4 BULKY (GAUZE/BANDAGES/DRESSINGS) IMPLANT
CANISTER WOUND CARE 500ML ATS (WOUND CARE) ×3 IMPLANT
CHLORAPREP W/TINT 26 (MISCELLANEOUS) IMPLANT
COVER BACK TABLE 60X90IN (DRAPES) IMPLANT
COVER WAND RF STERILE (DRAPES) IMPLANT
CUFF TOURN SGL QUICK 18X4 (TOURNIQUET CUFF) ×3 IMPLANT
DRAPE EXTREMITY T 121X128X90 (DISPOSABLE) IMPLANT
DRAPE IMP U-DRAPE 54X76 (DRAPES) IMPLANT
DRAPE OEC MINIVIEW 54X84 (DRAPES) ×3 IMPLANT
DRAPE U-SHAPE 47X51 STRL (DRAPES) IMPLANT
DRSG EMULSION OIL 3X3 NADH (GAUZE/BANDAGES/DRESSINGS) ×3 IMPLANT
DRSG PAD ABDOMINAL 8X10 ST (GAUZE/BANDAGES/DRESSINGS) IMPLANT
DRSG VAC ATS SM SENSATRAC (GAUZE/BANDAGES/DRESSINGS) ×3 IMPLANT
ELECT REM PT RETURN 15FT ADLT (MISCELLANEOUS) ×3 IMPLANT
GAUZE 4X4 16PLY RFD (DISPOSABLE) ×3 IMPLANT
GAUZE SPONGE 4X4 12PLY STRL (GAUZE/BANDAGES/DRESSINGS) IMPLANT
GAUZE XEROFORM 1X8 LF (GAUZE/BANDAGES/DRESSINGS) IMPLANT
GAUZE XEROFORM 5X9 LF (GAUZE/BANDAGES/DRESSINGS) IMPLANT
GLOVE BIO SURGEON STRL SZ7.5 (GLOVE) ×3 IMPLANT
GLOVE BIOGEL PI IND STRL 8 (GLOVE) ×1 IMPLANT
GLOVE BIOGEL PI INDICATOR 8 (GLOVE) ×2
GOWN STRL REUS W/ TWL LRG LVL3 (GOWN DISPOSABLE) ×1 IMPLANT
GOWN STRL REUS W/TWL LRG LVL3 (GOWN DISPOSABLE) ×3
GOWN STRL REUS W/TWL XL LVL3 (GOWN DISPOSABLE) ×3 IMPLANT
HANDPIECE INTERPULSE COAX TIP (DISPOSABLE) ×3
KIT BASIN OR (CUSTOM PROCEDURE TRAY) ×3 IMPLANT
MANIFOLD NEPTUNE II (INSTRUMENTS) ×3 IMPLANT
MATRIX WOUND MESHED 2X2 (Tissue) ×1 IMPLANT
NEEDLE HYPO 25X1 1.5 SAFETY (NEEDLE) IMPLANT
NS IRRIG 1000ML POUR BTL (IV SOLUTION) IMPLANT
PACK ORTHO EXTREMITY (CUSTOM PROCEDURE TRAY) ×3 IMPLANT
PADDING CAST ABS 4INX4YD NS (CAST SUPPLIES)
PADDING CAST ABS COTTON 4X4 ST (CAST SUPPLIES) IMPLANT
PENCIL SMOKE EVACUATOR (MISCELLANEOUS) IMPLANT
SET HNDPC FAN SPRY TIP SCT (DISPOSABLE) ×1 IMPLANT
SET IRRIG Y TYPE TUR BLADDER L (SET/KITS/TRAYS/PACK) IMPLANT
SLEEVE SCD COMPRESS KNEE MED (MISCELLANEOUS) IMPLANT
SPONGE SURGIFOAM ABS GEL 100 (HEMOSTASIS) IMPLANT
STAPLER VISISTAT 35W (STAPLE) IMPLANT
STOCKINETTE 8 INCH (MISCELLANEOUS) IMPLANT
SUT CHROMIC 4 0 SH 27 (SUTURE) ×3 IMPLANT
SUT ETHILON 3 0 PS 1 (SUTURE) IMPLANT
SUT ETHILON 4 0 PS 2 18 (SUTURE) IMPLANT
SUT MNCRL AB 3-0 PS2 18 (SUTURE) IMPLANT
SUT MNCRL AB 4-0 PS2 18 (SUTURE) IMPLANT
SUT MON AB 2-0 SH 27 (SUTURE) ×3
SUT MON AB 2-0 SH27 (SUTURE) ×1 IMPLANT
SUT MON AB 5-0 PS2 18 (SUTURE) IMPLANT
SUT PROLENE 2 0 CT2 30 (SUTURE) ×3 IMPLANT
SUT PROLENE 3 0 PS 1 (SUTURE) ×3 IMPLANT
SUT PROLENE 3 0 SH 48 (SUTURE) ×3 IMPLANT
SUT VIC AB 3-0 FS2 27 (SUTURE) IMPLANT
SUT VICRYL 4-0 PS2 18IN ABS (SUTURE) IMPLANT
SYR BULB EAR ULCER 3OZ GRN STR (SYRINGE) IMPLANT
SYR CONTROL 10ML LL (SYRINGE) IMPLANT
TOWEL OR 17X26 10 PK STRL BLUE (TOWEL DISPOSABLE) ×3 IMPLANT
UNDERPAD 30X36 HEAVY ABSORB (UNDERPADS AND DIAPERS) ×3 IMPLANT
WOUND MATRIX MESHED 2X2 (Tissue) ×2 IMPLANT
YANKAUER SUCT BULB TIP 10FT TU (MISCELLANEOUS) ×3 IMPLANT
YANKAUER SUCT BULB TIP NO VENT (SUCTIONS) IMPLANT

## 2020-02-12 NOTE — Telephone Encounter (Signed)
Wants to know if PT can have a clear liquid diet. If so, what time do they need to cut it off or keep him NPO. Please call nurse at [facility] back.

## 2020-02-12 NOTE — Telephone Encounter (Signed)
I spoke with Aaron [name] and she states pt is in surgery now.

## 2020-02-12 NOTE — Telephone Encounter (Signed)
Spoke with his nurse and confirmed OR time of 12pm. NPO until that time.  Lanae Crumbly, DPM 02/12/2020

## 2020-02-12 NOTE — Transfer of Care (Signed)
Immediate Anesthesia Transfer of Care Note  Patient: Aaron [name]  Procedure(s) Performed: IRRIGATION AND DEBRIDEMENT FOOT (Right Foot) APPLICATION OF WOUND VAC (Right Foot) SESAMOIDECTOMY (Right Foot)  Patient Location: PACU  Anesthesia Type:MAC  Level of Consciousness: awake and alert   Airway & Oxygen Therapy: Patient Spontanous Breathing and Patient connected to face mask oxygen  Post-op Assessment: Report given to RN and Post -op Vital signs reviewed and stable  Post vital signs: Reviewed and stable  Last Vitals:  Vitals Value Taken Time  BP 144/76 02/12/20 1417  Temp    Pulse 63 02/12/20 1418  Resp    SpO2 100 % 02/12/20 1418  Vitals shown include unvalidated device data.  Last Pain:  Vitals:   02/12/20 0541  TempSrc: Oral  PainSc:       Patients Stated Pain Goal: 2 (93/23/55 7322)  Complications: No complications documented.

## 2020-02-12 NOTE — Op Note (Signed)
Patient Name: ROBEY [name] DOB: [birth year]  MRN: [id number]   Date of Service: 02/12/2020  Surgeon: Dr. Lanae Crumbly, DPM Assistants: None Pre-operative Diagnosis:  1.  Severe diabetic foot infection right foot 2.  Complex open surgical wound 3.  Chronic plantar right foot ulcer 4.  Charcot neuropathic arthropathy right foot Post-operative Diagnosis:  1.  Severe diabetic foot infection right foot 2.  Open surgical wound 3.  Chronic plantar right foot ulcer 4.  Charcot neuropathic arthropathy right foot Procedures: 1. Complex repair of open surgical wound 2.  Sesamoidectomy right foot 3.  Preparation of wound bed for skin substitute graft right foot 4.  Application skin substitute right foot 5.  Application of negative pressure wound therapy  Pathology/Specimens: ID Type Source Tests Collected by Time Destination  1 : fibula sesamoid Tissue PATH Bone resection SURGICAL PATHOLOGY Criselda Peaches, DPM 02/12/2020 1238   2 : tibia sesamoid Tissue PATH Bone resection SURGICAL PATHOLOGY Criselda Peaches, DPM 02/12/2020 1318    Anesthesia: IV sedation Hemostasis:  Total Tourniquet Time Documented: Calf (Right) - 35 minutes Total: Calf (Right) - 35 minutes  Estimated Blood Loss: 50 mL Materials:  Implant Name Type Inv. Item Serial No. Manufacturer Lot No. LRB No. Used Action  WOUND MATRIX MESHED 2X2 - [id number] Tissue WOUND MATRIX MESHED 2X2  [employer] [id number] Right 1 Implanted   Medications: 10 cc 0.5% bupivacaine plain Complications: None  Indications for Procedure:  This is a 69 y.o. male with a history of recent necrotizing diabetic foot infection.  I took him to the operating room on 02/10/2020 for an initial debridement and incision and drainage of a complex deep abscess.  Since that time, he has received intravenous antibiotics.  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   Procedure in Detail: Patient was identified in pre-operative holding area. Formal consent was signed and the right lower extremity was marked. Patient was brought back to the operating room. Anesthesia was induced. The extremity was prepped and draped in the usual sterile fashion. Timeout was taken to confirm patient name, laterality, and procedure prior to incision.   Attention was then directed to the right foot which had a large open surgical wound in the first interspace extending from the dorsal skin through the web space and plantar to the sesamoid complex and first metatarsophalangeal joint.  The friable wound margins were excised of all nonviable tissue with a #15 blade sharply.  The wound bed was then debrided and prepared for the skin substitute graft with a sharp curette.  Once all nonviable tissue was debrided the wound was thoroughly irrigated with 3 L normal sterile saline with pulse lavage.  No purulence or further infection was encountered during this portion of the procedure.  Following irrigation, I then used sharp and blunt dissection to deepen the incision and carried down to the level of the sesamoid complex.  The proximal and distal sesamoidal and inner sesamoid ligaments were released sharply, and the proximal attachments of the flexor hallucis  brevis were released sharply.  Both sesamoids were individually excised and sent as pathologic specimens for histopathology.  The wound was then again irrigated with normal sterile saline.  Wound margins were then reexamined, and a flap was  raised medially and laterally along the inferior portions of the previous plantar circular ulceration.  Once enough tissue laxity was achieved, the medial flap was advanced distally and the lateral flap was advanced proximally in order to close the previous site of the neuropathic ulceration.  This was achieved in layers with 2-0 Biosyn for capsular and subcutaneous closure and 2-0 and 3-0 Prolene for skin using vertical and horizontal interrupted mattress sutures.  Attention was directed to the medial hallux where the previous counterincision was examined and curetted.  This was then closed with 3-0 Prolene in horizontal mattress interrupted fashion.  The dorsal portion of the open wound is well as the anterior portion in the webspace was then examined and I was unable to raise significant tissue flaps to develop enough laxity to primarily close the wound here.  I again curetted this wound base to develop a healthy granular base.  Hemostasis was achieved with cautery and was satisfactory.  An Integra meshed bilayer was then inset into the wound using 4-0 Chromic Gut suture in simple running fashion.  Good apposition of the skin substitute to the wound base was achieved.  Adaptic was placed over the bilayer and a negative pressure wound VAC therapy device sponge was then placed and a seal was achieved.  Secondary dressings of an ACE wrap was then applied.  The patient tolerated the procedure well.   Disposition: Following a period of post-operative monitoring, patient will be transferred back to the floor.  No further surgical procedures are planned at this time during this admission.

## 2020-02-12 NOTE — Progress Notes (Signed)
Pharmacy Antibiotic Note  Aaron [name] is a 69 y.o. male admitted on 02/09/2020 with DM foot infection.  Pharmacy has been consulted for vancomycin dosing. No Hx in Epic, but MD feels pt at high risk for MRSA involvement. 02/12/2020  D#4 Ceftriaxone/vancomycin/metronidazole for DM foot infection WBC WNL, AF, SCr WNL 7/27  s/p I&D 7/27 OR Cx: gram stain: abundant GNR, moderate gram positive cocci in pairs in clusters Today: return to OR for further washout and/or closure  F/u OR Cx> per podiatry can narrow as soon as OR Cx speciate   Plan:  Continue Vancomycin 1250 mg IV q12 hr   Continue Ceftriaxone 2 gm IV q24  Continue metronidazole 500 mg IV q8  SCr q48 while on vanc  F/u OR Cx> anticipate transition to oral abx soon   Height: 6\' 2"  (188 cm) Weight: 89.3 kg (196 lb 13.9 oz) IBW/kg (Calculated) : 82.2  Temp (24hrs), Avg:98.3 F (36.8 C), Min:98.1 F (36.7 C), Max:98.6 F (37 C)  Recent Labs  Lab 02/08/20 1439 02/09/20 1409 02/10/20 0429 02/11/20 0514 02/12/20 0511  WBC 16.9* 11.6* 9.0 8.6 8.5  CREATININE 1.06 1.03 0.74 0.73 0.61    Estimated Creatinine Clearance: 99.9 mL/min (by C-G formula based on SCr of 0.61 mg/dL).    No Known Allergies Antimicrobials this admission: 7/26 vanc >>  7/26 Rocephin >>  7/26 Flagyl >> Dose adjustments this admission: n/a  Microbiology results: 7/27 MRSA neg 7/27 SA neg 7/26 BCx2: ngtd 7/26 HIV NR 7/27 OR wound Cx: abundant GNR, mod GPC in pairs/clusters  Thank you for allowing pharmacy to be a part of this patient's care.  Eudelia Bunch, Pharm.D 02/12/2020 1:10 PM

## 2020-02-12 NOTE — Anesthesia Postprocedure Evaluation (Signed)
Anesthesia Post Note  Patient: Aaron [name]  Procedure(s) Performed: IRRIGATION AND DEBRIDEMENT FOOT (Right Foot) APPLICATION OF WOUND VAC (Right Foot) SESAMOIDECTOMY (Right Foot)     Patient location during evaluation: PACU Anesthesia Type: MAC Level of consciousness: awake and alert and oriented Pain management: pain level controlled Vital Signs Assessment: post-procedure vital signs reviewed and stable Respiratory status: spontaneous breathing, nonlabored ventilation and respiratory function stable Cardiovascular status: stable and blood pressure returned to baseline Postop Assessment: no apparent nausea or vomiting Anesthetic complications: no   No complications documented.  Last Vitals:  Vitals:   02/12/20 1500 02/12/20 1515  BP: (!) 161/74 (!) 154/80  Pulse: 64 64  Resp: 16 (!) 11  Temp:    SpO2: 100% 100%    Last Pain:  Vitals:   02/12/20 1500  TempSrc:   PainSc: 1                  Ly Bacchi A.

## 2020-02-12 NOTE — Brief Op Note (Signed)
02/12/2020  2:30 PM  PATIENT:  Aaron [name]  69 y.o. male  PRE-OPERATIVE DIAGNOSIS:  right foot diabetic foot infection  POST-OPERATIVE DIAGNOSIS:  right foot diabetic foot infection  PROCEDURE:  Procedure(s): IRRIGATION AND DEBRIDEMENT FOOT (Right) SECONDARY CLOSURE SURGICAL WOUND APPLICATION OF WOUND VAC (Right) SESAMOIDECTOMY (Right)  SURGEON:  Surgeon(s) and Role:    * Dhruva Orndoff, Stephan Minister, DPM - Primary  ASSISTANTS: none   ANESTHESIA:   IV sedation  EBL:  50 mL   BLOOD ADMINISTERED:none  DRAINS: NPWT VAC applied   LOCAL MEDICATIONS USED:  BUPIVICAINE 10cc  SPECIMEN:  Source of Specimen:  tibial and fibular sesamoid, sent as separate histopathologic specimens  DISPOSITION OF SPECIMEN:  PATHOLOGY  COUNTS:  YES  TOURNIQUET:   Total Tourniquet Time Documented: Calf (Right) - 35 minutes Total: Calf (Right) - 35 minutes   DICTATION: .Note written in EPIC  PLAN OF CARE: to inpatient unit  PATIENT DISPOSITION:  PACU - hemodynamically stable.   Delay start of Pharmacological VTE agent (>24hrs) due to surgical blood loss or risk of bleeding: yes  Lanae Crumbly, DPM 02/12/2020

## 2020-02-12 NOTE — Progress Notes (Signed)
[facility]                                                                              Patient Demographics  Aaron [name], is a 69 y.o. male, DOB - [birth year], ATF:[id number]  Admit date - 02/09/2020   Admitting Physician Harold Hedge, MD  Outpatient Primary MD for the patient is Jacelyn Pi, MD  Outpatient specialists:   LOS - 3  days   Medical records reviewed and are as summarized below:    No chief complaint on file.      Brief summary   Patient is a 68 year old male with past medical history of insulin-dependent type 2 diabetes, hypertension, hyperlipidemia, right foot diabetic ulcer for which he follows podiatry who was doing well until the weekend prior to admission when he began having fevers, chills and night sweats and presented to the ED on 7/25 at which time the ED physician performed an incision and drainage, gave a dose of ceftriaxone and discharged with Augmentin with podiatry follow-up. Patient presented to his podiatry office on the day of admission with Dr. Sherryle Lis who noted his right foot to be warm with erythema, necrosis and purulent drainage concerning for cellulitis and abscess of his right foot and was a direct admit to [facility]. Patient stated he only picked up the Augmentin on the morning of admission and only got 1 dose of this.     Assessment & Plan    Principal Problem:   Diabetic ulcer of right foot associated with type 2 diabetes mellitus ([place]), infected with surrounding cellulitis, abscess -Patient was placed on broad spectrum antibiotics with vancomycin, ceftriaxone and metronidazole -Podiatry consulted, patient underwent incision and drainage of the abscess and deep space infection below fascia, postop day #2 -Plan for repeat OR today, n.p.o. -Wound care dressing, wound VAC per podiatry service, -Prelim cultures with abundant gram-negative rods, moderate GPC's, follow sensitivities  Active  Problems: Diabetes mellitus type 2, uncontrolled with complications/diabetic ulcer, hyperglycemia -CBGs elevated likely due to illness and surgery, at home on invokamet, NPH insulin -Continue Lantus, sliding scale insulin -HbA1c 8.5  Essential hypertension -Controlled, continue amlodipine  Hyperlipidemia Continue statin.  Normocytic anemia H&H stable  Hypokalemia Resolved  Diarrhea -Patient reports had couple of episodes of diarrhea last night.  No fevers, no abdominal pain no leukocytosis to suggest C. Difficile.  Likely due to antibiotics - will add probiotic, Imodium as needed  Code Status: Full code DVT Prophylaxis:  Lovenox  Family Communication: Discussed all imaging results, lab results, explained to the patient and wife at the bedside.   Disposition Plan:     Status is: Inpatient  Remains inpatient appropriate because:Inpatient level of care appropriate due to severity of illness   Dispo:  Patient From: Home  Planned Disposition: Home  Expected discharge date: 02/13/20  Medically stable for discharge: No -> plan for repeat OR today, will follow podiatry service recommendations regarding discharge.       Time Spent in minutes   25 minutes minutes  Procedures:  Incision and drainage of the abscess right foot  Consultants:   Podiatry  Antimicrobials:   Anti-infectives (  From admission, onward)   Start     Dose/Rate Route Frequency Ordered Stop   02/09/20 1600  [MAR Hold]  cefTRIAXone (ROCEPHIN) 2 g in sodium chloride 0.9 % 100 mL IVPB     Discontinue     (MAR Hold since Thu 02/12/2020 at 1112.Hold Reason: Transfer to a Procedural area.)   2 g 200 mL/hr over 30 Minutes Intravenous Every 24 hours 02/09/20 1340     02/09/20 1600  [MAR Hold]  metroNIDAZOLE (FLAGYL) IVPB 500 mg     Discontinue     (MAR Hold since Thu 02/12/2020 at 1112.Hold Reason: Transfer to a Procedural area.)   500 mg 100 mL/hr over 60 Minutes Intravenous Every 8 hours 02/09/20 1340      02/09/20 1500  [MAR Hold]  vancomycin (VANCOREADY) IVPB 1250 mg/250 mL     Discontinue     (MAR Hold since Thu 02/12/2020 at 1112.Hold Reason: Transfer to a Procedural area.)   1,250 mg 166.7 mL/hr over 90 Minutes Intravenous 2 times daily 02/09/20 1404           Medications  Scheduled Meds: . [MAR Hold] amLODipine  5 mg Oral Daily  . [MAR Hold] enoxaparin (LOVENOX) injection  40 mg Subcutaneous Q24H  . [MAR Hold] insulin aspart  0-15 Units Subcutaneous TID WC  . [MAR Hold] insulin aspart  4 Units Subcutaneous TID WC  . [MAR Hold] insulin glargine  20 Units Subcutaneous QHS  . [MAR Hold] nutrition supplement (JUVEN)  1 packet Oral BID BM  . [MAR Hold] pregabalin  50 mg Oral QHS  . [MAR Hold] Ensure Max Protein  11 oz Oral Daily  . [MAR Hold] simvastatin  20 mg Oral QHS   Continuous Infusions: . [MAR Hold] sodium chloride    . [MAR Hold] cefTRIAXone (ROCEPHIN)  IV 2 g (02/11/20 1533)  . [MAR Hold] metronidazole 500 mg (02/12/20 0904)  . [MAR Hold] vancomycin Stopped (02/12/20 0640)   PRN Meds:.[MAR Hold] sodium chloride, [MAR Hold] acetaminophen **OR** [MAR Hold] acetaminophen, bupivacaine, fentaNYL (SUBLIMAZE) injection, [MAR Hold] hydrALAZINE, [MAR Hold] HYDROcodone-acetaminophen, [MAR Hold]  morphine injection, ondansetron (ZOFRAN) IV, oxyCODONE **OR** oxyCODONE, sodium chloride irrigation      Subjective:   Aaron [name] was seen and examined today.  Pain in the right foot controlled.  However states had 2 episodes of diarrhea last night.  No BM this morning.  No abdominal pain or fevers.  Patient denies dizziness, chest pain, shortness of breath, or any focal weakness.  Objective:   Vitals:   02/11/20 1348 02/11/20 2105 02/12/20 0541 02/12/20 1417  BP: 127/79 (!) 156/77 (!) 165/73 (!) 144/76  Pulse: 75 73 62 65  Resp: 17 16 14 16   Temp: 98.1 F (36.7 C) 98.3 F (36.8 C) 98.6 F (37 C) 97.6 F (36.4 C)  TempSrc:  Oral Oral   SpO2: 98% 96% 99% 100%  Weight:       Height:        Intake/Output Summary (Last 24 hours) at 02/12/2020 1429 Last data filed at 02/12/2020 1418 Gross per 24 hour  Intake 1590 ml  Output 50 ml  Net 1540 ml     Wt Readings from Last 3 Encounters:  02/09/20 89.3 kg  09/08/19 92.9 kg   Physical Exam  General: Alert and oriented x 3, NAD  Cardiovascular: S1 S2 clear, RRR.   Respiratory: Decreased breath sounds at the bases  Gastrointestinal: Soft, nontender, nondistended, NBS  Ext: no pedal edema, left, right foot dressing intact  Neuro: no new deficits  Musculoskeletal: No cyanosis, clubbing  Skin: Right foot dressing intact  Psych: Normal affect and demeanor, alert and oriented x3     Data Reviewed:  I have personally reviewed following labs and imaging studies  Micro Results Recent Results (from the past 240 hour(s))  WOUND CULTURE     Status: Abnormal (Preliminary result)   Collection Time: 02/09/20 11:03 AM   Specimen: Wound  Result Value Ref Range Status   MICRO NUMBER: [id number]  Preliminary   SPECIMEN QUALITY: Adequate  Preliminary   SOURCE: RIGHT FOOT  Preliminary   STATUS: PRELIMINARY  Preliminary   GRAM STAIN:   Preliminary    Few White blood cells seen No epithelial cells seen Many Gram positive cocci in pairs Many Gram negative bacilli   ISOLATE 1: Streptococcus pyogenes (A)  Preliminary    Comment: Heavy growth of Group A Streptococcus isolated , susceptibility test report to follow.  SARS Coronavirus 2 by RT PCR (hospital order, performed in [facility] hospital lab) Nasopharyngeal Nasopharyngeal Swab     Status: None   Collection Time: 02/09/20  1:37 PM   Specimen: Nasopharyngeal Swab  Result Value Ref Range Status   SARS Coronavirus 2 NEGATIVE NEGATIVE Final    Comment: (NOTE) SARS-CoV-2 target nucleic acids are NOT DETECTED.  The SARS-CoV-2 RNA is generally detectable in upper and lower respiratory specimens during the acute phase of infection. The lowest concentration of  SARS-CoV-2 viral copies this assay can detect is 250 copies / mL. A negative result does not preclude SARS-CoV-2 infection and should not be used as the sole basis for treatment or other patient management decisions.  A negative result may occur with improper specimen collection / handling, submission of specimen other than nasopharyngeal swab, presence of viral mutation(s) within the areas targeted by this assay, and inadequate number of viral copies (<250 copies / mL). A negative result must be combined with clinical observations, patient history, and epidemiological information.  Fact Sheet for Patients:   StrictlyIdeas.no  Fact Sheet for Healthcare Providers: BankingDealers.co.za  This test is not yet approved or  cleared by the [place] FDA and has been authorized for detection and/or diagnosis of SARS-CoV-2 by FDA under an Emergency Use Authorization (EUA).  This EUA will remain in effect (meaning this test can be used) for the duration of the COVID-19 declaration under Section 564(b)(1) of the Act, 21 U.S.C. section 360bbb-3(b)(1), unless the authorization is terminated or revoked sooner.  Performed at [facility], [place] [address], [place], [place] [address]   Blood Cultures x 2 sites     Status: None (Preliminary result)   Collection Time: 02/09/20  2:09 PM   Specimen: BLOOD  Result Value Ref Range Status   Specimen Description   Final    BLOOD RIGHT ANTECUBITAL Performed at [facility], [place] [address], [place], [place] [address]    Special Requests   Final    BOTTLES DRAWN AEROBIC ONLY Blood Culture adequate volume Performed at [place] [address], [place], [place] [address]    Culture   Final    NO GROWTH 3 DAYS Performed at [facility], [place] [address], [place], [place] [address]    Report Status PENDING  Incomplete  Blood Cultures x 2 sites     Status:  None (Preliminary result)   Collection Time: 02/09/20  2:09 PM   Specimen: BLOOD  Result Value Ref Range Status   Specimen  Description   Final    BLOOD LEFT ANTECUBITAL Performed at [facility], [place] [address] [place], [place] [address]    Special Requests   Final    BOTTLES DRAWN AEROBIC ONLY Blood Culture adequate volume Performed at [place] [address] [place], [place] [address]    Culture   Final    NO GROWTH 3 DAYS Performed at [facility], [place] [address] [place], [place] [address]    Report Status PENDING  Incomplete  Surgical pcr screen     Status: None   Collection Time: 02/10/20  5:29 AM   Specimen: Nasal Mucosa; Nasal Swab  Result Value Ref Range Status   MRSA, PCR NEGATIVE NEGATIVE Final   Staphylococcus aureus NEGATIVE NEGATIVE Final    Comment: (NOTE) The Xpert SA Assay (FDA approved for NASAL specimens in patients 81 years of age and older), is one component of a comprehensive surveillance program. It is not intended to diagnose infection nor to guide or monitor treatment. Performed at [facility], [place] [address], [place], [place] [address]   Aerobic/Anaerobic Culture (surgical/deep wound)     Status: None (Preliminary result)   Collection Time: 02/10/20  3:48 PM   Specimen: Wound  Result Value Ref Range Status   Specimen Description   Final    WOUND Performed at [place] [address], [place], [place] [address]    Special Requests DEEP WOUND CULTURE OF RIGHT FOOT  Final   Gram Stain   Final    RARE WBC PRESENT, PREDOMINANTLY PMN ABUNDANT GRAM NEGATIVE RODS MODERATE GRAM POSITIVE COCCI IN PAIRS IN CLUSTERS Performed at [facility], [place] [address], [place], [place] [address]    Culture   Final    CULTURE REINCUBATED FOR BETTER GROWTH NO ANAEROBES ISOLATED; CULTURE IN PROGRESS FOR 5 DAYS    Report Status PENDING  Incomplete    Radiology Reports MR FOOT  RIGHT W WO CONTRAST  Result Date: 01/30/2020 CLINICAL DATA:  Diabetic foot ulcer with nonhealing blister along the plantar aspect of the 1st MTP joint for 3 weeks. EXAM: MRI OF THE RIGHT FOREFOOT WITHOUT AND WITH CONTRAST TECHNIQUE: Multiplanar, multisequence MR imaging of the right forefoot was performed before and after the administration of intravenous contrast. CONTRAST:  98mL MULTIHANCE GADOBENATE DIMEGLUMINE 529 MG/ML IV SOLN COMPARISON:  Radiographs 01/26/2020 FINDINGS: Bones/Joint/Cartilage There is advanced arthropathy at the 1st metatarsophalangeal joint with advanced joint space narrowing, osteophytes and subchondral cyst formation, especially in the base of the proximal phalanx. There is relatively mild involvement of the sesamoids. No bone destruction, marrow edema or suspicious enhancement. No significant joint effusion. The additional metatarsophalangeal and interphalangeal joints appear normal. There are mild degenerative changes at the Lisfranc joint. Ligaments Intact Lisfranc ligament. Muscles and Tendons The forefoot tendons appear intact without significant tenosynovitis. There is mild nodular thickening of the central cord of the plantar fascia distally which may reflect mild plantar fibromatosis or inflammation. 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 No suspicious muscular enhancement. Soft tissues There is soft tissue ulceration along the plantar aspect of the medial forefoot at the level of the 1st MTP joint. There are underlying inflammatory changes and heterogeneous enhancement in the underlying subcutaneous fat, but no focal fluid collection or foreign body. IMPRESSION: 1. Soft tissue ulceration along the plantar aspect of the medial forefoot at the level of the 1st MTP joint with underlying inflammatory changes and heterogeneous  enhancement in the underlying subcutaneous fat. No evidence of abscess or foreign body. 2. No evidence of  osteomyelitis. 3. 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 4. 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 Electronically Signed   By: Richardean Sale M.D.   On: 01/30/2020 11:33   DG Foot 2 Views Right  Result Date: 02/08/2020 CLINICAL DATA:  Diabetic ulceration, foot swelling EXAM: RIGHT FOOT - 2 VIEW COMPARISON:  01/26/2020 FINDINGS: Extensive soft tissue swelling involving the right forefoot has progressed since prior examination. There has also, now all, developed subcutaneous gas between the first and second digits within the interspace no associated rows of changes within the adjacent osseous structures to suggest osteomyelitis. Well-circumscribed lytic lesion within the proximal phalanx of the great toe may represent a a unicameral bone cyst or phalangeal and chondroma. 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 Mild midfoot degenerative arthritis. Moderate plantar calcaneal spur. Vascular calcifications are noted within the soft tissues. IMPRESSION: Interval development of plantar wound with extensive soft tissue swelling involving the right forefoot and subcutaneous gas in keeping with aggressive infection. No associated osseous erosive changes. Hallux rigidus Electronically Signed   By: Fidela Salisbury MD   On: 02/08/2020 15:01   DG Foot Complete Right  Result Date: 02/02/2020 Please see detailed radiograph report in office note.   Lab Data:  CBC: Recent Labs  Lab 02/08/20 1439 02/09/20 1409 02/10/20 0429 02/11/20 0514 02/12/20 0511  WBC 16.9* 11.6* 9.0 8.6 8.5  NEUTROABS 15.3* 10.0*  --   --   --   HGB 13.7 12.7* 12.2* 11.4* 11.2*  HCT 42.3 38.8* 37.8* 33.7* 33.7*  MCV 90.2 89.8 89.6 88.0 88.5  PLT 292 299 271 254 272   Basic Metabolic Panel: Recent Labs  Lab 02/08/20 1439 02/09/20 1409 02/10/20 0429 02/11/20 0514 02/12/20 0511  NA 135 132* 136 135 134*  K 4.8 3.8  3.4* 3.8 3.8  CL 98 94* 101 103 103  CO2 22 24 25 22 22   GLUCOSE 326* 335* 175* 171* 160*  BUN 37* 36* 26* 21 18  CREATININE 1.06 1.03 0.74 0.73 0.61  CALCIUM 9.5 9.2 8.9 8.6* 8.7*  MG  --   --   --  2.2  --    GFR: Estimated Creatinine Clearance: 99.9 mL/min (by C-G formula based on SCr of 0.61 mg/dL). Liver Function Tests: No results for input(s): AST, ALT, ALKPHOS, BILITOT, PROT, ALBUMIN in the last 168 hours. No results for input(s): LIPASE, AMYLASE in the last 168 hours. No results for input(s): AMMONIA in the last 168 hours. Coagulation Profile: No results for input(s): INR, PROTIME in the last 168 hours. Cardiac Enzymes: No results for input(s): CKTOTAL, CKMB, CKMBINDEX, TROPONINI in the last 168 hours. BNP (last 3 results) No results for input(s): PROBNP in the last 8760 hours. HbA1C: No results for input(s): HGBA1C in the last 72 hours. CBG: Recent Labs  Lab 02/09/20 1941 02/10/20 0726 02/10/20 1643 02/11/20 1715 02/12/20 1122  GLUCAP 224* 203* 135* 194* 148*   Lipid Profile: No results for input(s): CHOL, HDL, LDLCALC, TRIG, CHOLHDL, LDLDIRECT in the last 72 hours. Thyroid Function Tests: No results for input(s): TSH, T4TOTAL, FREET4, T3FREE, THYROIDAB in the last 72 hours. Anemia Panel: No results for input(s): VITAMINB12, FOLATE, FERRITIN, TIBC, IRON, RETICCTPCT in the last 72 hours. Urine analysis:    Component Value Date/Time   COLORURINE YELLOW 02/18/2010 2152   APPEARANCEUR CLEAR 02/18/2010  2152   LABSPEC 1.031 (H) 02/18/2010 2152   PHURINE 6.5 02/18/2010 2152   GLUCOSEU >1000 (A) 02/18/2010 2152   HGBUR NEGATIVE 02/18/2010 2152   BILIRUBINUR negative 06/13/2019 1142   KETONESUR trace (5) (A) 06/13/2019 1142   KETONESUR NEGATIVE 02/18/2010 2152   PROTEINUR =100 (A) 06/13/2019 1142   PROTEINUR NEGATIVE 02/18/2010 2152   UROBILINOGEN 1.0 06/13/2019 1142   UROBILINOGEN 1.0 02/18/2010 2152   NITRITE Negative 06/13/2019 1142   NITRITE NEGATIVE  02/18/2010 2152   LEUKOCYTESUR Negative 06/13/2019 1142     Yajahira Tison M.D. [facility] 02/12/2020, 2:29 PM   Call night coverage person covering after 7pm

## 2020-02-12 NOTE — Anesthesia Preprocedure Evaluation (Signed)
Anesthesia Evaluation  Patient identified by MRN, date of birth, ID band Patient awake    Reviewed: Allergy & Precautions, NPO status , Patient's Chart, lab work & pertinent test results, reviewed documented beta blocker date and time   Airway Mallampati: II  TM Distance: >3 FB Neck ROM: Full    Dental  (+) Edentulous Upper, Edentulous Lower   Pulmonary former smoker,    Pulmonary exam normal breath sounds clear to auscultation       Cardiovascular hypertension, Pt. on medications Normal cardiovascular exam Rhythm:Regular Rate:Normal     Neuro/Psych negative neurological ROS  negative psych ROS   GI/Hepatic negative GI ROS, Neg liver ROS,   Endo/Other  diabetes, Poorly Controlled, Type 2, Insulin Dependent, Oral Hypoglycemic AgentsHyperlipidemia  Renal/GU negative Renal ROS  negative genitourinary   Musculoskeletal Diabetic foot ulcer   Abdominal   Peds  Hematology  (+) anemia ,   Anesthesia Other Findings   Reproductive/Obstetrics                             Anesthesia Physical  Anesthesia Plan  ASA: III  Anesthesia Plan: MAC   Post-op Pain Management:    Induction: Intravenous  PONV Risk Score and Plan: 2 and Propofol infusion, Ondansetron and Treatment may vary due to age or medical condition  Airway Management Planned: Natural Airway and Simple Face Mask  Additional Equipment:   Intra-op Plan:   Post-operative Plan:   Informed Consent: I have reviewed the patients History and Physical, chart, labs and discussed the procedure including the risks, benefits and alternatives for the proposed anesthesia with the patient or authorized representative who has indicated his/her understanding and acceptance.       Plan Discussed with: CRNA and Anesthesiologist  Anesthesia Plan Comments:         Anesthesia Quick Evaluation

## 2020-02-12 NOTE — Care Management Important Message (Signed)
Important Message  Patient Details IM Letter given to the Patient Name: Aaron [name] MRN: [id number] Date of Birth: 07-09-1951   Medicare Important Message Given:  Yes     Kerin Salen 02/12/2020, 10:24 AM

## 2020-02-13 ENCOUNTER — Encounter (HOSPITAL_COMMUNITY): Payer: Self-pay | Admitting: Podiatry

## 2020-02-13 DIAGNOSIS — L97413 Non-pressure chronic ulcer of right heel and midfoot with necrosis of muscle: Secondary | ICD-10-CM

## 2020-02-13 LAB — BASIC METABOLIC PANEL
Anion gap: 5 (ref 5–15)
BUN: 13 mg/dL (ref 8–23)
CO2: 24 mmol/L (ref 22–32)
Calcium: 8.4 mg/dL — ABNORMAL LOW (ref 8.9–10.3)
Chloride: 104 mmol/L (ref 98–111)
Creatinine, Ser: 0.66 mg/dL (ref 0.61–1.24)
GFR calc Af Amer: >60 mL/min (ref >60)
GFR calc non Af Amer: >60 mL/min (ref >60)
Glucose, Bld: 92 mg/dL (ref 70–99)
Potassium: 3.8 mmol/L (ref 3.5–5.1)
Sodium: 133 mmol/L — ABNORMAL LOW (ref 135–145)

## 2020-02-13 LAB — CBC
HCT: 34 % — ABNORMAL LOW (ref 39.0–52.0)
Hemoglobin: 11.3 g/dL — ABNORMAL LOW (ref 13.0–17.0)
MCH: 29.4 pg (ref 26.0–34.0)
MCHC: 33.2 g/dL (ref 30.0–36.0)
MCV: 88.5 fL (ref 80.0–100.0)
Platelets: 258 10*3/uL (ref 150–400)
RBC: 3.84 MIL/uL — ABNORMAL LOW (ref 4.22–5.81)
RDW: 12.8 % (ref 11.5–15.5)
WBC: 8.3 10*3/uL (ref 4.0–10.5)
nRBC: 0 % (ref 0.0–0.2)

## 2020-02-13 LAB — GLUCOSE, CAPILLARY: Glucose-Capillary: 182 mg/dL — ABNORMAL HIGH (ref 70–99)

## 2020-02-13 MED ORDER — SODIUM CHLORIDE 0.9 % IV SOLN
3.0000 g | Freq: Four times a day (QID) | INTRAVENOUS | Status: DC
  Administered 2020-02-13 – 2020-02-15 (×6): 3 g via INTRAVENOUS
  Filled 2020-02-13: qty 3
  Filled 2020-02-13 (×2): qty 8
  Filled 2020-02-13 (×5): qty 3

## 2020-02-13 NOTE — TOC Initial Note (Signed)
Transition of Care [facility]) - Initial/Assessment Note    Patient Details  Name: Aaron [name] MRN: [id number] Date of Birth: [birth year]  Transition of Care [facility]) CM/SW Contact:    Lia Hopping, [place] Phone Number:  02/13/2020, 12:11 PM  Clinical Narrative: Re: Pt. Will discharge home w/[employer] wound vac, Home Health RN.     Nurse reached out to podiatry for wound vac/care orders.        Physician put in the orders for a home wound vac. CSW faxed the [employer] short form application to the physicians office to complete.       CSW met with the patient and his spouse at bedside to discharge plan home. CSW introduced role and reason for the visit. CSW discussed home health agencies and offered choice.  CSW confirmed [facility] will provide RN care Tuesday, Thursday and Saturday schedule.  Patient spouse has requested a 3 IN 1 for the patient. She is concern about his weightbearing status.    Expected Discharge Plan: [place] Barriers to Discharge: Continued Medical Work up   Patient Goals and CMS Choice   CMS Medicare.gov Compare Post Acute Care list provided to:: Patient Choice offered to / list presented to : Patient  Expected Discharge Plan and Services Expected Discharge Plan: [place] In-house Referral: Clinical Social Work Discharge Planning Services: CM Consult Post Acute Care Choice: [place] arrangements for the past 2 months: Single Family Home                 DME Arranged: Vac DME Agency: [employer] Date DME Agency Contacted: 02/13/20 Time DME Agency Contacted: [id number] Representative spoke with at DME Agency: Leontine Locket HH Arranged: RN [place] Agency: [place] Date [place]: 02/13/20 Time [place]: 1137 Representative spoke with at [place]: Cindie  Prior Living Arrangements/Services Living arrangements for the past 2 months: [place] with:: Self Patient language and need  for interpreter reviewed:: No Do you feel safe going back to the place where you live?: Yes      Need for Family Participation in Patient Care: Yes (Comment) Care giver support system in place?: Yes (comment)   Criminal Activity/Legal Involvement Pertinent to Current Situation/Hospitalization: No - Comment as needed  Activities of Daily Living Home Assistive Devices/Equipment: None ADL Screening (condition at time of admission) Patient's cognitive ability adequate to safely complete daily activities?: Yes Is the patient deaf or have difficulty hearing?: No Does the patient have difficulty seeing, even when wearing glasses/contacts?: No Does the patient have difficulty concentrating, remembering, or making decisions?: No Patient able to express need for assistance with ADLs?: Yes Does the patient have difficulty dressing or bathing?: No Independently performs ADLs?: Yes (appropriate for developmental age) Does the patient have difficulty walking or climbing stairs?: No Weakness of Legs: None Weakness of Arms/Hands: None  Permission Sought/Granted Permission sought to share information with : Facility Art therapist granted to share information with : Yes, Verbal Permission Granted     Permission granted to share info w AGENCY: Home Health Agency        Emotional Assessment Appearance:: Appears stated age Attitude/Demeanor/Rapport: Engaged Affect (typically observed): Accepting Orientation: : Oriented to Self, Oriented to Place, Oriented to  Time, Oriented to Situation Alcohol / Substance Use: Not Applicable Psych Involvement: No (comment)  Admission diagnosis:  Foot abscess [L02.619] Patient Active Problem List   Diagnosis Date Noted   Foot  abscess 02/09/2020   Diabetic ulcer of right foot associated with type 2 diabetes mellitus ([place]) 01/26/2020   Neuropathy due to type 2 diabetes mellitus ([place]) 01/26/2020   Hallux rigidus of right foot 01/26/2020    Onychomycosis 01/26/2020   PCP:  Jacelyn Pi, MD Pharmacy:   [id number] [place], [id number]: [phone]: [phone] [place]([place], [place] [address]  ([place] [address]Phone: [phone]: [phone]    Social Determinants of Health (SDOH) Interventions    Readmission Risk Interventions No flowsheet data found.

## 2020-02-13 NOTE — Progress Notes (Signed)
[facility]                                                                              Patient Demographics  Aaron [name], is a 69 y.o. male, DOB - March 23, 1951, TKZ:[id number]  Admit date - 02/09/2020   Admitting Physician Harold Hedge, MD  Outpatient Primary MD for the patient is Jacelyn Pi, MD  Outpatient specialists:   LOS - 4  days   Medical records reviewed and are as summarized below:    No chief complaint on file.      Brief summary   Patient is a 69 year old male with past medical history of insulin-dependent type 2 diabetes, hypertension, hyperlipidemia, right foot diabetic ulcer for which he follows podiatry who was doing well until the weekend prior to admission when he began having fevers, chills and night sweats and presented to the ED on 7/25 at which time the ED physician performed an incision and drainage, gave a dose of ceftriaxone and discharged with Augmentin with podiatry follow-up. Patient presented to his podiatry office on the day of admission with Dr. Sherryle Lis who noted his right foot to be warm with erythema, necrosis and purulent drainage concerning for cellulitis and abscess of his right foot and was a direct admit to [facility] long hospital. Patient stated he only picked up the Augmentin on the morning of admission and only got 1 dose of this.     Assessment & Plan    Principal Problem:   Diabetic ulcer of right foot associated with type 2 diabetes mellitus ([place]), infected with surrounding cellulitis, abscess -Patient was placed on broad spectrum antibiotics with vancomycin, ceftriaxone and metronidazole -Podiatry consulted, patient underwent incision and drainage of the abscess and deep space infection below fascia 7/27.  Went for repeat OR on 7/29, wound VAC placement -Home wound care and wound VAC orders per podiatry -Cultures growing Streptococcus pyogenes, sensitivities pending  Active Problems: Diabetes mellitus type  2, uncontrolled with complications/diabetic ulcer, hyperglycemia -CBGs elevated likely due to illness and surgery, at home on invokamet, NPH insulin -Continue Lantus, sliding scale insulin -HbA1c 8.5  Essential hypertension -BP control, continue amlodipine  Hyperlipidemia Continue statin.  Normocytic anemia H&H stable  Hypokalemia Resolved  Diarrhea -Resolved  Code Status: Full code DVT Prophylaxis:  Lovenox  Family Communication: Discussed all imaging results, lab results, explained to the patient and wife at the bedside   Disposition Plan:     Status is: Inpatient  Remains inpatient appropriate because:Inpatient level of care appropriate due to severity of illness   Dispo:  Patient From: Home  Planned Disposition: Home  Expected discharge date: 02/13/20  Medically stable for discharge: No -> possible DC tomorrow, awaiting podiatry recommendations and clearance for discharge and sensitivities       Time Spent in minutes   25 minutes minutes  Procedures:  Incision and drainage of the abscess right foot Wound VAC placement  Consultants:   Podiatry  Antimicrobials:   Anti-infectives (From admission, onward)   Start     Dose/Rate Route Frequency Ordered Stop   02/09/20 1600  cefTRIAXone (ROCEPHIN) 2 g in sodium chloride 0.9 % 100  mL IVPB     Discontinue     2 g 200 mL/hr over 30 Minutes Intravenous Every 24 hours 02/09/20 1340     02/09/20 1600  metroNIDAZOLE (FLAGYL) IVPB 500 mg     Discontinue     500 mg 100 mL/hr over 60 Minutes Intravenous Every 8 hours 02/09/20 1340     02/09/20 1500  vancomycin (VANCOREADY) IVPB 1250 mg/250 mL     Discontinue     1,250 mg 166.7 mL/hr over 90 Minutes Intravenous 2 times daily 02/09/20 1404           Medications  Scheduled Meds: . amLODipine  5 mg Oral Daily  . enoxaparin (LOVENOX) injection  40 mg Subcutaneous Q24H  . insulin aspart  0-15 Units Subcutaneous TID WC  . insulin aspart  4 Units Subcutaneous  TID WC  . insulin glargine  20 Units Subcutaneous QHS  . nutrition supplement (JUVEN)  1 packet Oral BID BM  . pregabalin  50 mg Oral QHS  . Ensure Max Protein  11 oz Oral Daily  . simvastatin  20 mg Oral QHS   Continuous Infusions: . sodium chloride    . cefTRIAXone (ROCEPHIN)  IV 2 g (02/12/20 1613)  . metronidazole 500 mg (02/13/20 0857)  . vancomycin 1,250 mg (02/13/20 0540)   PRN Meds:.sodium chloride, acetaminophen **OR** acetaminophen, hydrALAZINE, HYDROcodone-acetaminophen, loperamide, morphine injection      Subjective:   Caedyn Raygoza was seen and examined today.  Diarrhea resolved, right foot dressing intact with wound VAC.  No acute issues overnight.  No fevers.  Patient denies dizziness, chest pain, shortness of breath, or any focal weakness.  Objective:   Vitals:   02/12/20 2103 02/13/20 0109 02/13/20 0551 02/13/20 1344  BP: (!) 157/69 (!) 158/68 (!) 150/72 (!) 158/70  Pulse: 71 70 65 76  Resp: 14 14 14 18   Temp: 98.2 F (36.8 C) 98.6 F (37 C) 98.1 F (36.7 C) 98 F (36.7 C)  TempSrc: Oral Oral Oral Oral  SpO2: 100% 98% 99% 100%  Weight:      Height:        Intake/Output Summary (Last 24 hours) at 02/13/2020 1410 Last data filed at 02/13/2020 1000 Gross per 24 hour  Intake 1772.45 ml  Output 850 ml  Net 922.45 ml     Wt Readings from Last 3 Encounters:  02/09/20 89.3 kg  09/08/19 92.9 kg   Physical Exam  General: Alert and oriented x 3, NAD  Cardiovascular: S1 S2 clear, RRR. No pedal edema b/l  Respiratory: CTAB, no wheezing, rales or rhonchi  Gastrointestinal: Soft, nontender, nondistended, NBS  Ext: no pedal edema bilaterally  Neuro: no new deficits  Musculoskeletal: No cyanosis, clubbing  Skin: Right foot dressing intact with wound VAC  Psych: Normal affect and demeanor, alert and oriented x3     Data Reviewed:  I have personally reviewed following labs and imaging studies  Micro Results Recent Results (from the past 240  hour(s))  WOUND CULTURE     Status: Abnormal (Preliminary result)   Collection Time: 02/09/20 11:03 AM   Specimen: Wound  Result Value Ref Range Status   MICRO NUMBER: [id number]  Preliminary   SPECIMEN QUALITY: Adequate  Preliminary   SOURCE: RIGHT FOOT  Preliminary   STATUS: PRELIMINARY  Preliminary   GRAM STAIN:   Preliminary    Few White blood cells seen No epithelial cells seen Many Gram positive cocci in pairs Many Gram negative bacilli   ISOLATE 1: Streptococcus  pyogenes (A)  Preliminary    Comment: Heavy growth of Group A Streptococcus isolated , susceptibility test report to follow.  SARS Coronavirus 2 by RT PCR (hospital order, performed in [facility] hospital lab) Nasopharyngeal Nasopharyngeal Swab     Status: None   Collection Time: 02/09/20  1:37 PM   Specimen: Nasopharyngeal Swab  Result Value Ref Range Status   SARS Coronavirus 2 NEGATIVE NEGATIVE Final    Comment: (NOTE) SARS-CoV-2 target nucleic acids are NOT DETECTED.  The SARS-CoV-2 RNA is generally detectable in upper and lower respiratory specimens during the acute phase of infection. The lowest concentration of SARS-CoV-2 viral copies this assay can detect is 250 copies / mL. A negative result does not preclude SARS-CoV-2 infection and should not be used as the sole basis for treatment or other patient management decisions.  A negative result may occur with improper specimen collection / handling, submission of specimen other than nasopharyngeal swab, presence of viral mutation(s) within the areas targeted by this assay, and inadequate number of viral copies (<250 copies / mL). A negative result must be combined with clinical observations, patient history, and epidemiological information.  Fact Sheet for Patients:   StrictlyIdeas.no  Fact Sheet for Healthcare Providers: BankingDealers.co.za  This test is not yet approved or  cleared by the [place] FDA  and has been authorized for detection and/or diagnosis of SARS-CoV-2 by FDA under an Emergency Use Authorization (EUA).  This EUA will remain in effect (meaning this test can be used) for the duration of the COVID-19 declaration under Section 564(b)(1) of the Act, 21 U.S.C. section 360bbb-3(b)(1), unless the authorization is terminated or revoked sooner.  Performed at [facility], [place] [address], [place], [place] [address]   Blood Cultures x 2 sites     Status: None (Preliminary result)   Collection Time: 02/09/20  2:09 PM   Specimen: BLOOD  Result Value Ref Range Status   Specimen Description   Final    BLOOD RIGHT ANTECUBITAL Performed at [facility], [place] [address], [place], [place] [address]    Special Requests   Final    BOTTLES DRAWN AEROBIC ONLY Blood Culture adequate volume Performed at [place] [address], [place], [place] [address]    Culture   Final    NO GROWTH 3 DAYS Performed at [facility], [place] [address] [place], [place] [address]    Report Status PENDING  Incomplete  Blood Cultures x 2 sites     Status: None (Preliminary result)   Collection Time: 02/09/20  2:09 PM   Specimen: BLOOD  Result Value Ref Range Status   Specimen Description   Final    BLOOD LEFT ANTECUBITAL Performed at [facility], [place] [address], [place], [place] [address]    Special Requests   Final    BOTTLES DRAWN AEROBIC ONLY Blood Culture adequate volume Performed at [place] [address], [place], [place] [address]    Culture   Final    NO GROWTH 3 DAYS Performed at [facility], [place] [address] [place], [place] [address]    Report Status PENDING  Incomplete  Surgical pcr screen     Status: None   Collection Time: 02/10/20  5:29 AM   Specimen: Nasal Mucosa; Nasal Swab  Result Value Ref Range Status   MRSA, PCR NEGATIVE NEGATIVE Final   Staphylococcus aureus NEGATIVE NEGATIVE Final     Comment: (NOTE)  The Xpert SA Assay (FDA approved for NASAL specimens in patients 19 years of age and older), is one component of a comprehensive surveillance program. It is not intended to diagnose infection nor to guide or monitor treatment. Performed at [facility], [place] [address], [place], [place] [address]   Aerobic/Anaerobic Culture (surgical/deep wound)     Status: None (Preliminary result)   Collection Time: 02/10/20  3:48 PM   Specimen: Wound  Result Value Ref Range Status   Specimen Description   Final    WOUND Performed at [place] [address], [place],  [address]    Special Requests DEEP WOUND CULTURE OF RIGHT FOOT  Final   Gram Stain   Final    RARE WBC PRESENT, PREDOMINANTLY PMN ABUNDANT GRAM NEGATIVE RODS MODERATE GRAM POSITIVE COCCI IN PAIRS IN CLUSTERS Performed at [facility], [place] [address], [place], [place] [address]    Culture   Final    MODERATE GROUP A STREP (S.PYOGENES) ISOLATED Beta hemolytic streptococci are predictably susceptible to penicillin and other beta lactams. Susceptibility testing not routinely performed. NO ANAEROBES ISOLATED; CULTURE IN PROGRESS FOR 5 DAYS    Report Status PENDING  Incomplete    Radiology Reports MR FOOT RIGHT W WO CONTRAST  Result Date: 01/30/2020 CLINICAL DATA:  Diabetic foot ulcer with nonhealing blister along the plantar aspect of the 1st MTP joint for 3 weeks. EXAM: MRI OF THE RIGHT FOREFOOT WITHOUT AND WITH CONTRAST TECHNIQUE: Multiplanar, multisequence MR imaging of the right forefoot was performed before and after the administration of intravenous contrast. CONTRAST:  64mL MULTIHANCE GADOBENATE DIMEGLUMINE 529 MG/ML IV SOLN COMPARISON:  Radiographs 01/26/2020 FINDINGS: Bones/Joint/Cartilage There is advanced arthropathy at the 1st metatarsophalangeal joint with advanced joint space narrowing, osteophytes and subchondral cyst formation, especially in the  base of the proximal phalanx. There is relatively mild involvement of the sesamoids. No bone destruction, marrow edema or suspicious enhancement. No significant joint effusion. The additional metatarsophalangeal and interphalangeal joints appear normal. There are mild degenerative changes at the Lisfranc joint. Ligaments Intact Lisfranc ligament. Muscles and Tendons The forefoot tendons appear intact without significant tenosynovitis. There is mild nodular thickening of the central cord of the plantar fascia distally which may reflect mild plantar fibromatosis or inflammation. 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 No suspicious muscular enhancement. Soft tissues There is soft tissue ulceration along the plantar aspect of the medial forefoot at the level of the 1st MTP joint. There are underlying inflammatory changes and heterogeneous enhancement in the underlying subcutaneous fat, but no focal fluid collection or foreign body. IMPRESSION: 1. Soft tissue ulceration along the plantar aspect of the medial forefoot at the level of the 1st MTP joint with underlying inflammatory changes and heterogeneous enhancement in the underlying subcutaneous fat. No evidence of abscess or foreign body. 2. No evidence of osteomyelitis. 3. 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 4. 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 Electronically Signed   By: Richardean Sale M.D.   On: 01/30/2020 11:33   DG Foot 2 Views Right  Result Date: 02/08/2020 CLINICAL DATA:  Diabetic ulceration, foot swelling EXAM: RIGHT FOOT - 2 VIEW COMPARISON:  01/26/2020 FINDINGS: Extensive soft tissue swelling involving the right forefoot has progressed since prior examination. There has also, now all, developed subcutaneous gas between the first and second digits within the interspace no associated rows of changes within the adjacent osseous structures to suggest osteomyelitis. Well-circumscribed lytic lesion within  the  proximal phalanx of the great toe may represent a a unicameral bone cyst or phalangeal and chondroma. 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 Mild midfoot degenerative arthritis. Moderate plantar calcaneal spur. Vascular calcifications are noted within the soft tissues. IMPRESSION: Interval development of plantar wound with extensive soft tissue swelling involving the right forefoot and subcutaneous gas in keeping with aggressive infection. No associated osseous erosive changes. Hallux rigidus Electronically Signed   By: Fidela Salisbury MD   On: 02/08/2020 15:01   DG Foot Complete Right  Result Date: 02/02/2020 Please see detailed radiograph report in office note.   Lab Data:  CBC: Recent Labs  Lab 02/08/20 1439 02/08/20 1439 02/09/20 1409 02/10/20 0429 02/11/20 0514 02/12/20 0511 02/13/20 0516  WBC 16.9*   < > 11.6* 9.0 8.6 8.5 8.3  NEUTROABS 15.3*  --  10.0*  --   --   --   --   HGB 13.7   < > 12.7* 12.2* 11.4* 11.2* 11.3*  HCT 42.3   < > 38.8* 37.8* 33.7* 33.7* 34.0*  MCV 90.2   < > 89.8 89.6 88.0 88.5 88.5  PLT 292   < > 299 271 254 245 258   < > = values in this interval not displayed.   Basic Metabolic Panel: Recent Labs  Lab 02/09/20 1409 02/10/20 0429 02/11/20 0514 02/12/20 0511 02/13/20 0516  NA 132* 136 135 134* 133*  K 3.8 3.4* 3.8 3.8 3.8  CL 94* 101 103 103 104  CO2 24 25 22 22 24   GLUCOSE 335* 175* 171* 160* 92  BUN 36* 26* 21 18 13   CREATININE 1.03 0.74 0.73 0.61 0.66  CALCIUM 9.2 8.9 8.6* 8.7* 8.4*  MG  --   --  2.2  --   --    GFR: Estimated Creatinine Clearance: 99.9 mL/min (by C-G formula based on SCr of 0.66 mg/dL). Liver Function Tests: No results for input(s): AST, ALT, ALKPHOS, BILITOT, PROT, ALBUMIN in the last 168 hours. No results for input(s): LIPASE, AMYLASE in the last 168 hours. No results for input(s): AMMONIA in the  last 168 hours. Coagulation Profile: No results for input(s): INR, PROTIME in the last 168 hours. Cardiac Enzymes: No results for input(s): CKTOTAL, CKMB, CKMBINDEX, TROPONINI in the last 168 hours. BNP (last 3 results) No results for input(s): PROBNP in the last 8760 hours. HbA1C: No results for input(s): HGBA1C in the last 72 hours. CBG: Recent Labs  Lab 02/10/20 0726 02/10/20 1643 02/11/20 1715 02/12/20 1122 02/13/20 0839  GLUCAP 203* 135* 194* 148* 182*   Lipid Profile: No results for input(s): CHOL, HDL, LDLCALC, TRIG, CHOLHDL, LDLDIRECT in the last 72 hours. Thyroid Function Tests: No results for input(s): TSH, T4TOTAL, FREET4, T3FREE, THYROIDAB in the last 72 hours. Anemia Panel: No results for input(s): VITAMINB12, FOLATE, FERRITIN, TIBC, IRON, RETICCTPCT in the last 72 hours. Urine analysis:    Component Value Date/Time   COLORURINE YELLOW 02/18/2010 2152   APPEARANCEUR CLEAR 02/18/2010 2152   LABSPEC 1.031 (H) 02/18/2010 2152   PHURINE 6.5 02/18/2010 2152   GLUCOSEU >1000 (A) 02/18/2010 2152   HGBUR NEGATIVE 02/18/2010 2152   BILIRUBINUR negative 06/13/2019 1142   KETONESUR trace (5) (A) 06/13/2019 1142   KETONESUR NEGATIVE 02/18/2010 2152   PROTEINUR =100 (A) 06/13/2019 1142   PROTEINUR NEGATIVE 02/18/2010 2152   UROBILINOGEN 1.0 06/13/2019 1142   UROBILINOGEN 1.0 02/18/2010 2152   NITRITE Negative  06/13/2019 1142   NITRITE NEGATIVE 02/18/2010 2152   LEUKOCYTESUR Negative 06/13/2019 [place] M.D. [facility] 02/13/2020, 2:10 PM   Call night coverage person covering after 7pm

## 2020-02-13 NOTE — Progress Notes (Addendum)
  Subjective:  Patient ID: Aaron [name], male    DOB: 1951-03-30,  MRN: [id number]  Feeling quite well today.  He is here today with his wife.  No further issues with diarrhea.  Minor pain in foot  Negative for chest pain and shortness of breath Chest pain: no Shortness of breath: no Fever: no Night sweats: no Weight loss: no Constitutional signs: no Review of all other systems is negative Objective:   Vitals:   02/13/20 0551 02/13/20 1344  BP: (!) 150/72 (!) 158/70  Pulse: 65 76  Resp: 14 18  Temp: 98.1 F (36.7 C) 98 F (36.7 C)  SpO2: 99% 100%   General AA&O x3. Normal mood and affect.  Vascular Dorsalis pedis and posterior tibial pulses 2/4 bilat. Brisk capillary refill to all digits.  Neurologic Epicritic sensation grossly absent.  Dermatologic  wound VAC operating appropriately  Orthopedic: MMT 5/5 in dorsiflexion, plantarflexion, inversion, and eversion.     Assessment & Plan:  Patient was evaluated and treated and all questions answered.   -He is doing well now 1 day status post right foot wound bed prep with skin substitute application, sesamoidectomy, and complex wound closure -Continue wound VAC 125 mmHg continuous suction -First change can be Sunday, 02/15/2020.  Leave Adaptic and silicone layer of skin substitute graft in place, apply new sponge with windowed adhesive strips -Home care orders placed as well as wound VAC orders placed. -His cultures are with group A strep pyogenes, consistent with his outpatient wound culture.  They also show gram-negative bacilli, which have not speciated yet.  Hopeful to DC on 2 weeks p.o. antibiotics -Continue nonweightbearing right lower extremity.  He is wife you have concerns about him being able to maintain nonweightbearing.  Consider PT eval prior to discharge -He has an appointment with me on 02/19/2020 and can keep this for outpatient follow-up Criselda Peaches, DPM  Accessible via secure chat for questions or  concerns.

## 2020-02-14 LAB — CULTURE, BLOOD (ROUTINE X 2)
Culture: NO GROWTH
Culture: NO GROWTH
Special Requests: ADEQUATE
Special Requests: ADEQUATE

## 2020-02-14 LAB — WOUND CULTURE
MICRO NUMBER:: 10749424
SPECIMEN QUALITY:: ADEQUATE

## 2020-02-14 LAB — GLUCOSE, CAPILLARY
Glucose-Capillary: 153 mg/dL — ABNORMAL HIGH (ref 70–99)
Glucose-Capillary: 190 mg/dL — ABNORMAL HIGH (ref 70–99)
Glucose-Capillary: 168 mg/dL — ABNORMAL HIGH (ref 70–99)
Glucose-Capillary: 150 mg/dL — ABNORMAL HIGH (ref 70–99)

## 2020-02-14 LAB — CBC
HCT: 34 % — ABNORMAL LOW (ref 39.0–52.0)
Hemoglobin: 11.3 g/dL — ABNORMAL LOW (ref 13.0–17.0)
MCH: 29.2 pg (ref 26.0–34.0)
MCHC: 33.2 g/dL (ref 30.0–36.0)
MCV: 87.9 fL (ref 80.0–100.0)
Platelets: 246 10*3/uL (ref 150–400)
RBC: 3.87 MIL/uL — ABNORMAL LOW (ref 4.22–5.81)
RDW: 12.8 % (ref 11.5–15.5)
WBC: 5.9 10*3/uL (ref 4.0–10.5)
nRBC: 0 % (ref 0.0–0.2)

## 2020-02-14 LAB — BASIC METABOLIC PANEL
Anion gap: 8 (ref 5–15)
BUN: 11 mg/dL (ref 8–23)
CO2: 22 mmol/L (ref 22–32)
Calcium: 8.3 mg/dL — ABNORMAL LOW (ref 8.9–10.3)
Chloride: 104 mmol/L (ref 98–111)
Creatinine, Ser: 0.69 mg/dL (ref 0.61–1.24)
GFR calc Af Amer: >60 mL/min (ref >60)
GFR calc non Af Amer: >60 mL/min (ref >60)
Glucose, Bld: 141 mg/dL — ABNORMAL HIGH (ref 70–99)
Potassium: 3.6 mmol/L (ref 3.5–5.1)
Sodium: 134 mmol/L — ABNORMAL LOW (ref 135–145)

## 2020-02-14 NOTE — Evaluation (Signed)
Occupational Therapy Evaluation Patient Details Name: Aaron [name] MRN: [id number] DOB: 04-19-1951 Today's Date: 02/14/2020    History of Present Illness Pt s/p I&D of R foot with wound vac placement.  Pt with hx of DM and back surgery   Clinical Impression   Mr. Yosiel Thieme is a 69 year old man s/p I&D of right foot with wound vac and NWB status. On evaluation patient demonstrates normal ROM and strength of upper extremities and ability to perform most ADLs from seated position at modified independence. Patient needed min guard for any standing tasks and verbal cues to keep right foot off the ground but otherwise could perform ADLs. Therapist discussed compensatory strategies and DME needed for safe return home with patient and family and they verbalized understanding. Patient's family very supportive and prepared for patient's return home. No further OT needs.     Follow Up Recommendations  No OT follow up    Equipment Recommendations  3 in 1 bedside commode    Recommendations for Other Services       Precautions / Restrictions Precautions Precautions: Fall Restrictions Weight Bearing Restrictions: Yes RLE Weight Bearing: Non weight bearing      Mobility Bed Mobility Overal bed mobility: Modified Independent                Transfers   Equipment used: Rolling walker (2 wheeled) Transfers: Sit to/from Stand Sit to Stand: Min guard         General transfer comment: Patient performed standing at side of bed with RW. min guard and verbal cues to keep right foot up needed.    Balance Overall balance assessment: Independent   Sitting balance-Leahy Scale: Normal     Standing balance support: Single extremity supported Standing balance-Leahy Scale: Fair Standing balance comment: Patient could stand with one hand to perform ADLs. min guard required however.                           ADL either performed or assessed with clinical judgement    ADL Overall ADL's : Needs assistance/impaired Eating/Feeding: Independent   Grooming: Independent   Upper Body Bathing: Independent   Lower Body Bathing: Minimal assistance;Sit to/from stand   Upper Body Dressing : Independent   Lower Body Dressing: Min guard;Sit to/from stand Lower Body Dressing Details (indicate cue type and reason): Patient demonstrates ability to donn sock seated at side of bed. Therapist had patient stand and perform pseudo dressing activity. Patient demonstrates ability to pull clothing up with one hand hold on walker. slightly unsteady with min guard required. Recommended min guard to family. Toilet Transfer: Administrator and Hygiene: Min guard;Sit to/from stand       Functional mobility during ADLs: Min guard;Rolling walker       Vision   Vision Assessment?: No apparent visual deficits     Perception     Praxis      Pertinent Vitals/Pain Pain Assessment: No/denies pain     Hand Dominance     Extremity/Trunk Assessment Upper Extremity Assessment Upper Extremity Assessment: RUE deficits/detail;LUE deficits/detail;Overall WFL for tasks assessed RUE Deficits / Details: 5/5 strength throughout LUE Deficits / Details: 5/5 strength throughout   Lower Extremity Assessment Lower Extremity Assessment: Defer to PT evaluation       Communication Communication Communication: No difficulties   Cognition Arousal/Alertness: Awake/alert Behavior During Therapy: WFL for tasks assessed/performed Overall Cognitive Status: Within Functional Limits for tasks  assessed                                     General Comments       Exercises     Shoulder Instructions      Home Living Family/patient expects to be discharged to:: Private residence Living Arrangements: Spouse/significant other Available Help at Discharge: Family Type of Home: House Home Access: Stairs to enter Technical brewer  of Steps: 3 Entrance Stairs-Rails: Right;Left Home Layout: One level     Bathroom Shower/Tub: Teacher, early years/pre: [employer] - single point;Tub bench;Wheelchair - manual   Additional Comments: Wife states may have RW but is checking on it      Prior Functioning/Environment Level of Independence: Independent                 OT Problem List:        OT Treatment/Interventions:      OT Goals(Current goals can be found in the care plan section) Acute Rehab OT Goals Patient Stated Goal: Foot to heal and regain IND OT Goal Formulation: All assessment and education complete, DC therapy  OT Frequency:     Barriers to D/C:            Co-evaluation              AM-PAC OT "6 Clicks" Daily Activity     Outcome Measure Help from another person eating meals?: None Help from another person taking care of personal grooming?: None Help from another person toileting, which includes using toliet, bedpan, or urinal?: A Little Help from another person bathing (including washing, rinsing, drying)?: A Little Help from another person to put on and taking off regular upper body clothing?: None Help from another person to put on and taking off regular lower body clothing?: A Little 6 Click Score: 21   End of Session Equipment Utilized During Treatment: Rolling walker Nurse Communication: Mobility status  Activity Tolerance: Patient tolerated treatment well Patient left: in bed;with call bell/phone within reach;with family/visitor present  OT Visit Diagnosis: Other abnormalities of gait and mobility (R26.89)                Time: 6433-2951 OT Time Calculation (min): 14 min Charges:  OT General Charges $OT Visit: 1 Visit OT Evaluation $OT Eval Low Complexity: 1 Low  Almyra Birman, OTR/L [place]  Office [phone] Pager: [phone]   Lenward Chancellor 02/14/2020, 3:34 PM

## 2020-02-14 NOTE — Evaluation (Signed)
Physical Therapy Evaluation Patient Details Name: Aaron [name] MRN: [id number] DOB: 10/07/1950 Today's Date: 02/14/2020   History of Present Illness  Pt s/p I&D of R foot with wound vac placement.  Pt with hx of DM and back surgery  Clinical Impression  Pt admitted as above and presenting with functional mobility limitations 2* generalized weakness, limited endurance and NWB on R LE.  Pt should progress to dc home with assist of family and would benefit from follow up HHPT to further address deficits and insure pt ability to function safely in home environment.    Follow Up Recommendations Home health PT    Equipment Recommendations  Rolling walker with 5" wheels;3in1 (PT) (spouse if checking on borrowing RW)    Recommendations for Other Services       Precautions / Restrictions Precautions Precautions: Fall Restrictions Weight Bearing Restrictions: Yes RLE Weight Bearing: Non weight bearing      Mobility  Bed Mobility Overal bed mobility: Modified Independent             General bed mobility comments: supine<>sit with assist to manage lines only  Transfers Overall transfer level: Needs assistance Equipment used: Rolling walker (2 wheeled) Transfers: Sit to/from Stand Sit to Stand: Min assist         General transfer comment: cues for LE management, NWB on R LE, and use of UEs to self assist.  Physical assist to bring wt up and fwd and to balance in initial standing.  Ambulation/Gait Ambulation/Gait assistance: Min assist Gait Distance (Feet): 17 Feet Assistive device: Rolling walker (2 wheeled) Gait Pattern/deviations: Step-to pattern;Decreased step length - right;Decreased step length - left;Shuffle Gait velocity: decr   General Gait Details: cues for posture, position from RW, sequence, and NWB on R LE  Stairs            Wheelchair Mobility    Modified Rankin (Stroke Patients Only)       Balance Overall balance assessment: Needs  assistance Sitting-balance support: Feet supported;No upper extremity supported Sitting balance-Leahy Scale: Good     Standing balance support: Bilateral upper extremity supported Standing balance-Leahy Scale: Poor Standing balance comment: Steady assist with RW and cues to maintain NWB on R LE                             Pertinent Vitals/Pain Pain Assessment: No/denies pain    Home Living Family/patient expects to be discharged to:: Private residence Living Arrangements: Spouse/significant other Available Help at Discharge: Family Type of Home: House Home Access: Stairs to enter Entrance Stairs-Rails: Psychiatric nurse of Steps: 3 Home Layout: One level Home Equipment: Cane - single point;Tub bench;Wheelchair - manual Additional Comments: Wife states may have RW but is checking on it    Prior Function Level of Independence: Independent               Hand Dominance        Extremity/Trunk Assessment   Upper Extremity Assessment Upper Extremity Assessment: Generalized weakness    Lower Extremity Assessment Lower Extremity Assessment: Generalized weakness       Communication   Communication: No difficulties  Cognition Arousal/Alertness: Awake/alert Behavior During Therapy: Flat affect;WFL for tasks assessed/performed Overall Cognitive Status: Within Functional Limits for tasks assessed  General Comments      Exercises     Assessment/Plan    PT Assessment Patient needs continued PT services  PT Problem List Decreased strength;Decreased activity tolerance;Decreased balance;Decreased mobility;Decreased knowledge of use of DME       PT Treatment Interventions DME instruction;Gait training;Stair training;Functional mobility training;Therapeutic activities;Therapeutic exercise;Patient/family education    PT Goals (Current goals can be found in the Care Plan section)  Acute  Rehab PT Goals Patient Stated Goal: Foot to heal and regain IND PT Goal Formulation: With patient Time For Goal Achievement: 02/28/20 Potential to Achieve Goals: Good    Frequency Min 5X/week   Barriers to discharge        Co-evaluation               AM-PAC PT "6 Clicks" Mobility  Outcome Measure Help needed turning from your back to your side while in a flat bed without using bedrails?: None Help needed moving from lying on your back to sitting on the side of a flat bed without using bedrails?: None Help needed moving to and from a bed to a chair (including a wheelchair)?: A Little Help needed standing up from a chair using your arms (e.g., wheelchair or bedside chair)?: A Little Help needed to walk in hospital room?: A Little Help needed climbing 3-5 steps with a railing? : A Lot 6 Click Score: 19    End of Session Equipment Utilized During Treatment: Gait belt Activity Tolerance: Patient tolerated treatment well Patient left: with call bell/phone within reach;in bed;with family/visitor present Nurse Communication: Mobility status PT Visit Diagnosis: Unsteadiness on feet (R26.81);Difficulty in walking, not elsewhere classified (R26.2);Muscle weakness (generalized) (M62.81)    Time: 9166-0600 PT Time Calculation (min) (ACUTE ONLY): 30 min   Charges:   PT Evaluation $PT Eval Low Complexity: 1 Low PT Treatments $Gait Training: 8-22 mins        [place] Pager [phone] Office [phone]   Elizabeth Haff 02/14/2020, 12:45 PM

## 2020-02-14 NOTE — Progress Notes (Signed)
[facility]                                                                              Patient Demographics  Aaron [name], is a 69 y.o. male, DOB - 1951-05-04, BTD:[id number]  Admit date - 02/09/2020   Admitting Physician Harold Hedge, MD  Outpatient Primary MD for the patient is Jacelyn Pi, MD  Outpatient specialists:   LOS - 5  days   Medical records reviewed and are as summarized below:    No chief complaint on file.      Brief summary   Patient is a 69 year old male with past medical history of insulin-dependent type 2 diabetes, hypertension, hyperlipidemia, right foot diabetic ulcer for which he follows podiatry who was doing well until the weekend prior to admission when he began having fevers, chills and night sweats and presented to the ED on 7/25 at which time the ED physician performed an incision and drainage, gave a dose of ceftriaxone and discharged with Augmentin with podiatry follow-up. Patient presented to his podiatry office on the day of admission with Dr. Sherryle Lis who noted his right foot to be warm with erythema, necrosis and purulent drainage concerning for cellulitis and abscess of his right foot and was a direct admit to [facility]. Patient stated he only picked up the Augmentin on the morning of admission and only got 1 dose of this.     Assessment & Plan    Principal Problem:   Diabetic ulcer of right foot associated with type 2 diabetes mellitus ([place]), infected with surrounding cellulitis, abscess -Patient was placed on broad spectrum antibiotics with vancomycin, ceftriaxone and metronidazole -Podiatry consulted, patient underwent incision and drainage of the abscess and deep space infection below fascia 7/27.  Went for repeat OR on 7/29, wound VAC placed, continue suction 125 mmHg -PT OT evaluation today.  Per podiatry, Dr. Sherryle Lis, first change wound VAC on 8/1, with dressing changes as per note.  Home care  orders and wound VAC orders placed. -Continue nonweightbearing right lower extremity.  Outpatient appointment on 02/19/2020 -Afebrile, no leukocytosis, wound cultures showed moderate group strep pyogenous, changed to IV Unasyn, will transition to oral Augmentin for 2 weeks  Active Problems: Diabetes mellitus type 2, uncontrolled with complications/diabetic ulcer, hyperglycemia -CBGs elevated likely due to illness and surgery, at home on invokamet, NPH insulin -Continue Lantus, sliding scale insulin -HbA1c 8.5  Essential hypertension -BP stable, continue amlodipine  Hyperlipidemia Continue statin.  Normocytic anemia H&H stable  Hypokalemia Resolved  Diarrhea -Resolved  Code Status: Full code DVT Prophylaxis:  Lovenox  Family Communication: Discussed all imaging results, lab results, explained to the patient and wife at the bedside   Disposition Plan:     Status is: Inpatient  Remains inpatient appropriate because:Inpatient level of care appropriate due to severity of illness   Dispo:  Patient From: Home  Planned Disposition: Home  Expected discharge date: 02/14/20  Medically stable for discharge: No -> discussed with case management, home health agency cannot come to the house over the weekend.. Will DC home in a.m. after wound VAC change in a.m, PT eval.  Time  Spent in minutes   25 minutes minutes  Procedures:  Incision and drainage of the abscess right foot Wound VAC placement  Consultants:   Podiatry  Antimicrobials:   Anti-infectives (From admission, onward)   Start     Dose/Rate Route Frequency Ordered Stop   02/14/20 0000  Ampicillin-Sulbactam (UNASYN) 3 g in sodium chloride 0.9 % 100 mL IVPB     Discontinue     3 g 200 mL/hr over 30 Minutes Intravenous Every 6 hours 02/13/20 1806     02/09/20 1600  cefTRIAXone (ROCEPHIN) 2 g in sodium chloride 0.9 % 100 mL IVPB  Status:  Discontinued        2 g 200 mL/hr over 30 Minutes Intravenous Every 24 hours  02/09/20 1340 02/13/20 1806   02/09/20 1600  metroNIDAZOLE (FLAGYL) IVPB 500 mg  Status:  Discontinued        500 mg 100 mL/hr over 60 Minutes Intravenous Every 8 hours 02/09/20 1340 02/13/20 1806   02/09/20 1500  vancomycin (VANCOREADY) IVPB 1250 mg/250 mL  Status:  Discontinued        1,250 mg 166.7 mL/hr over 90 Minutes Intravenous 2 times daily 02/09/20 1404 02/13/20 1806         Medications  Scheduled Meds: . amLODipine  5 mg Oral Daily  . enoxaparin (LOVENOX) injection  40 mg Subcutaneous Q24H  . insulin aspart  0-15 Units Subcutaneous TID WC  . insulin aspart  4 Units Subcutaneous TID WC  . insulin glargine  20 Units Subcutaneous QHS  . nutrition supplement (JUVEN)  1 packet Oral BID BM  . pregabalin  50 mg Oral QHS  . Ensure Max Protein  11 oz Oral Daily  . simvastatin  20 mg Oral QHS   Continuous Infusions: . sodium chloride    . ampicillin-sulbactam (UNASYN) IV 3 g (02/14/20 1227)   PRN Meds:.sodium chloride, acetaminophen **OR** acetaminophen, hydrALAZINE, HYDROcodone-acetaminophen, loperamide, morphine injection      Subjective:   Aaron [name] was seen and examined today.  No acute complaints had BM but no diarrhea.  No abdominal pain, fevers or chills.  Wound VAC on.  Pain in the right foot controlled.  No fevers.  Patient denies dizziness, chest pain, shortness of breath, or any focal weakness.  Objective:   Vitals:   02/13/20 1344 02/13/20 2121 02/14/20 0549 02/14/20 1358  BP: (!) 158/70 (!) 152/73 (!) 160/83 (!) 143/59  Pulse: 76 70 60 75  Resp: 18 17 16 22   Temp: 98 F (36.7 C) 98.4 F (36.9 C) 99.3 F (37.4 C) 97.7 F (36.5 C)  TempSrc: Oral Oral Oral Oral  SpO2: 100% 97% 97% 98%  Weight:      Height:        Intake/Output Summary (Last 24 hours) at 02/14/2020 1447 Last data filed at 02/14/2020 1400 Gross per 24 hour  Intake 2247.58 ml  Output 1550 ml  Net 697.58 ml     Wt Readings from Last 3 Encounters:  02/09/20 89.3 kg    09/08/19 92.9 kg   Physical Exam  General: Alert and oriented x 3, NAD  Cardiovascular: S1 S2 clear, RRR. No pedal edema b/l  Respiratory: CTAB, no wheezing, rales or rhonchi  Gastrointestinal: Soft, nontender, nondistended, NBS  Ext: no pedal edema bilaterally  Neuro: no new deficits  Musculoskeletal: No cyanosis, clubbing  Skin: Right foot dressing intact with wound VAC  Psych: Normal affect and demeanor, alert and oriented x3      Data Reviewed:  I have personally reviewed following labs and imaging studies  Micro Results Recent Results (from the past 240 hour(s))  WOUND CULTURE     Status: Abnormal (Preliminary result)   Collection Time: 02/09/20 11:03 AM   Specimen: Wound  Result Value Ref Range Status   MICRO NUMBER: [id number]  Preliminary   SPECIMEN QUALITY: Adequate  Preliminary   SOURCE: RIGHT FOOT  Preliminary   STATUS: PRELIMINARY  Preliminary   GRAM STAIN:   Preliminary    Few White blood cells seen No epithelial cells seen Many Gram positive cocci in pairs Many Gram negative bacilli   ISOLATE 1: Streptococcus pyogenes (A)  Preliminary    Comment: Heavy growth of Group A Streptococcus isolated , susceptibility test report to follow.  SARS Coronavirus 2 by RT PCR (hospital order, performed in [facility] hospital lab) Nasopharyngeal Nasopharyngeal Swab     Status: None   Collection Time: 02/09/20  1:37 PM   Specimen: Nasopharyngeal Swab  Result Value Ref Range Status   SARS Coronavirus 2 NEGATIVE NEGATIVE Final    Comment: (NOTE) SARS-CoV-2 target nucleic acids are NOT DETECTED.  The SARS-CoV-2 RNA is generally detectable in upper and lower respiratory specimens during the acute phase of infection. The lowest concentration of SARS-CoV-2 viral copies this assay can detect is 250 copies / mL. A negative result does not preclude SARS-CoV-2 infection and should not be used as the sole basis for treatment or other patient management decisions.  A negative  result may occur with improper specimen collection / handling, submission of specimen other than nasopharyngeal swab, presence of viral mutation(s) within the areas targeted by this assay, and inadequate number of viral copies (<250 copies / mL). A negative result must be combined with clinical observations, patient history, and epidemiological information.  Fact Sheet for Patients:   StrictlyIdeas.no  Fact Sheet for Healthcare Providers: BankingDealers.co.za  This test is not yet approved or  cleared by the [place] FDA and has been authorized for detection and/or diagnosis of SARS-CoV-2 by FDA under an Emergency Use Authorization (EUA).  This EUA will remain in effect (meaning this test can be used) for the duration of the COVID-19 declaration under Section 564(b)(1) of the Act, 21 U.S.C. section 360bbb-3(b)(1), unless the authorization is terminated or revoked sooner.  Performed at [facility], [place] [address], [place], [place] [address]   Blood Cultures x 2 sites     Status: None   Collection Time: 02/09/20  2:09 PM   Specimen: BLOOD  Result Value Ref Range Status   Specimen Description   Final    BLOOD RIGHT ANTECUBITAL Performed at [facility], [place] [address] [place], [place] [address]    Special Requests   Final    BOTTLES DRAWN AEROBIC ONLY Blood Culture adequate volume Performed at [place] [address], [place], [place] [address]    Culture   Final    NO GROWTH 5 DAYS Performed at [facility], [place] [address], [place], [place] [address]    Report Status 02/14/2020 FINAL  Final  Blood Cultures x 2 sites     Status: None   Collection Time: 02/09/20  2:09 PM   Specimen: BLOOD  Result Value Ref Range Status   Specimen Description   Final    BLOOD LEFT ANTECUBITAL Performed at [facility], [place] [address] [place], [place] [address]    Special  Requests   Final    BOTTLES  DRAWN AEROBIC ONLY Blood Culture adequate volume Performed at [place] [address] [place], [place] [address]    Culture   Final    NO GROWTH 5 DAYS Performed at [facility], [place] [address], [place], [place] [address]    Report Status 02/14/2020 FINAL  Final  Surgical pcr screen     Status: None   Collection Time: 02/10/20  5:29 AM   Specimen: Nasal Mucosa; Nasal Swab  Result Value Ref Range Status   MRSA, PCR NEGATIVE NEGATIVE Final   Staphylococcus aureus NEGATIVE NEGATIVE Final    Comment: (NOTE) The Xpert SA Assay (FDA approved for NASAL specimens in patients 61 years of age and older), is one component of a comprehensive surveillance program. It is not intended to diagnose infection nor to guide or monitor treatment. Performed at [facility], [place] [address] [place], [place] [address]   Aerobic/Anaerobic Culture (surgical/deep wound)     Status: None (Preliminary result)   Collection Time: 02/10/20  3:48 PM   Specimen: Wound  Result Value Ref Range Status   Specimen Description   Final    WOUND Performed at [place] [address], [place], [place] [address]    Special Requests DEEP WOUND CULTURE OF RIGHT FOOT  Final   Gram Stain   Final    RARE WBC PRESENT, PREDOMINANTLY PMN ABUNDANT GRAM NEGATIVE RODS MODERATE GRAM POSITIVE COCCI IN PAIRS IN CLUSTERS Performed at [facility], [place] [address], [place], [place] [address]    Culture   Final    MODERATE GROUP A STREP (S.PYOGENES) ISOLATED Beta hemolytic streptococci are predictably susceptible to penicillin and other beta lactams. Susceptibility testing not routinely performed. NO ANAEROBES ISOLATED; CULTURE IN PROGRESS FOR 5 DAYS    Report Status PENDING  Incomplete    Radiology Reports MR FOOT RIGHT W WO CONTRAST  Result Date: 01/30/2020 CLINICAL DATA:  Diabetic foot ulcer with nonhealing blister along  the plantar aspect of the 1st MTP joint for 3 weeks. EXAM: MRI OF THE RIGHT FOREFOOT WITHOUT AND WITH CONTRAST TECHNIQUE: Multiplanar, multisequence MR imaging of the right forefoot was performed before and after the administration of intravenous contrast. CONTRAST:  80mL MULTIHANCE GADOBENATE DIMEGLUMINE 529 MG/ML IV SOLN COMPARISON:  Radiographs 01/26/2020 FINDINGS: Bones/Joint/Cartilage There is advanced arthropathy at the 1st metatarsophalangeal joint with advanced joint space narrowing, osteophytes and subchondral cyst formation, especially in the base of the proximal phalanx. There is relatively mild involvement of the sesamoids. No bone destruction, marrow edema or suspicious enhancement. No significant joint effusion. The additional metatarsophalangeal and interphalangeal joints appear normal. There are mild degenerative changes at the Lisfranc joint. Ligaments Intact Lisfranc ligament. Muscles and Tendons The forefoot tendons appear intact without significant tenosynovitis. There is mild nodular thickening of the central cord of the plantar fascia distally which may reflect mild plantar fibromatosis or inflammation. 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 No suspicious muscular enhancement. Soft tissues There is soft tissue ulceration along the plantar aspect of the medial forefoot at the level of the 1st MTP joint. There are underlying inflammatory changes and heterogeneous enhancement in the underlying subcutaneous fat, but no focal fluid collection or foreign body. IMPRESSION: 1. Soft tissue ulceration along the plantar aspect of the medial forefoot at the level of the 1st MTP joint with underlying inflammatory changes and heterogeneous enhancement in the underlying subcutaneous fat. No evidence of abscess or foreign body. 2. No evidence  of osteomyelitis. 3. 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 4. 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 Electronically Signed    By: Richardean Sale M.D.   On: 01/30/2020 11:33   DG Foot 2 Views Right  Result Date: 02/08/2020 CLINICAL DATA:  Diabetic ulceration, foot swelling EXAM: RIGHT FOOT - 2 VIEW COMPARISON:  01/26/2020 FINDINGS: Extensive soft tissue swelling involving the right forefoot has progressed since prior examination. There has also, now all, developed subcutaneous gas between the first and second digits within the interspace no associated rows of changes within the adjacent osseous structures to suggest osteomyelitis. Well-circumscribed lytic lesion within the proximal phalanx of the great toe may represent a a unicameral bone cyst or phalangeal and chondroma. 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 Mild midfoot degenerative arthritis. Moderate plantar calcaneal spur. Vascular calcifications are noted within the soft tissues. IMPRESSION: Interval development of plantar wound with extensive soft tissue swelling involving the right forefoot and subcutaneous gas in keeping with aggressive infection. No associated osseous erosive changes. Hallux rigidus Electronically Signed   By: Fidela Salisbury MD   On: 02/08/2020 15:01   DG Foot Complete Right  Result Date: 02/02/2020 Please see detailed radiograph report in office note.   Lab Data:  CBC: Recent Labs  Lab 02/08/20 1439 02/08/20 1439 02/09/20 1409 02/09/20 1409 02/10/20 0429 02/11/20 0514 02/12/20 0511 02/13/20 0516 02/14/20 0543  WBC 16.9*   < > 11.6*   < > 9.0 8.6 8.5 8.3 5.9  NEUTROABS 15.3*  --  10.0*  --   --   --   --   --   --   HGB 13.7   < > 12.7*   < > 12.2* 11.4* 11.2* 11.3* 11.3*  HCT 42.3   < > 38.8*   < > 37.8* 33.7* 33.7* 34.0* 34.0*  MCV 90.2   < > 89.8   < > 89.6 88.0 88.5 88.5 87.9  PLT 292   < > 299   < > 271 254 245 258 246   < > = values in this interval not displayed.   Basic Metabolic Panel: Recent Labs  Lab  02/10/20 0429 02/11/20 0514 02/12/20 0511 02/13/20 0516 02/14/20 0543  NA 136 135 134* 133* 134*  K 3.4* 3.8 3.8 3.8 3.6  CL 101 103 103 104 104  CO2 25 22 22 24 22   GLUCOSE 175* 171* 160* 92 141*  BUN 26* 21 18 13 11   CREATININE 0.74 0.73 0.61 0.66 0.69  CALCIUM 8.9 8.6* 8.7* 8.4* 8.3*  MG  --  2.2  --   --   --    GFR: Estimated Creatinine Clearance: 99.9 mL/min (by C-G formula based on SCr of 0.69 mg/dL). Liver Function Tests: No results for input(s): AST, ALT, ALKPHOS, BILITOT, PROT, ALBUMIN in the last 168 hours. No results for input(s): LIPASE, AMYLASE in the last 168 hours. No results for input(s): AMMONIA in the last 168 hours. Coagulation Profile: No results for input(s): INR, PROTIME in the last 168 hours. Cardiac Enzymes: No results for input(s): CKTOTAL, CKMB, CKMBINDEX, TROPONINI in the last 168 hours. BNP (last 3 results) No results for input(s): PROBNP in the last 8760 hours. HbA1C: No results for input(s): HGBA1C in the last 72 hours. CBG: Recent Labs  Lab 02/11/20 1715 02/12/20 1122 02/13/20 0839 02/14/20 0754 02/14/20 1146  GLUCAP 194* 148* 182* 153* 190*   Lipid Profile: No results for  input(s): CHOL, HDL, LDLCALC, TRIG, CHOLHDL, LDLDIRECT in the last 72 hours. Thyroid Function Tests: No results for input(s): TSH, T4TOTAL, FREET4, T3FREE, THYROIDAB in the last 72 hours. Anemia Panel: No results for input(s): VITAMINB12, FOLATE, FERRITIN, TIBC, IRON, RETICCTPCT in the last 72 hours. Urine analysis:    Component Value Date/Time   COLORURINE YELLOW 02/18/2010 2152   APPEARANCEUR CLEAR 02/18/2010 2152   LABSPEC 1.031 (H) 02/18/2010 2152   PHURINE 6.5 02/18/2010 2152   GLUCOSEU >1000 (A) 02/18/2010 2152   HGBUR NEGATIVE 02/18/2010 2152   BILIRUBINUR negative 06/13/2019 1142   KETONESUR trace (5) (A) 06/13/2019 1142   KETONESUR NEGATIVE 02/18/2010 2152   PROTEINUR =100 (A) 06/13/2019 1142   PROTEINUR NEGATIVE 02/18/2010 2152   UROBILINOGEN 1.0  06/13/2019 1142   UROBILINOGEN 1.0 02/18/2010 2152   NITRITE Negative 06/13/2019 1142   NITRITE NEGATIVE 02/18/2010 2152   LEUKOCYTESUR Negative 06/13/2019 1142     Aaron [name] M.D. [facility] 02/14/2020, 2:47 PM   Call night coverage person covering after 7pm

## 2020-02-15 LAB — BASIC METABOLIC PANEL
Anion gap: 7 (ref 5–15)
BUN: 13 mg/dL (ref 8–23)
CO2: 25 mmol/L (ref 22–32)
Calcium: 8.4 mg/dL — ABNORMAL LOW (ref 8.9–10.3)
Chloride: 106 mmol/L (ref 98–111)
Creatinine, Ser: 0.7 mg/dL (ref 0.61–1.24)
GFR calc Af Amer: >60 mL/min (ref >60)
GFR calc non Af Amer: >60 mL/min (ref >60)
Glucose, Bld: 131 mg/dL — ABNORMAL HIGH (ref 70–99)
Potassium: 3.3 mmol/L — ABNORMAL LOW (ref 3.5–5.1)
Sodium: 138 mmol/L (ref 135–145)

## 2020-02-15 LAB — AEROBIC/ANAEROBIC CULTURE W GRAM STAIN (SURGICAL/DEEP WOUND)

## 2020-02-15 LAB — CBC
HCT: 32.6 % — ABNORMAL LOW (ref 39.0–52.0)
Hemoglobin: 10.9 g/dL — ABNORMAL LOW (ref 13.0–17.0)
MCH: 29.4 pg (ref 26.0–34.0)
MCHC: 33.4 g/dL (ref 30.0–36.0)
MCV: 87.9 fL (ref 80.0–100.0)
Platelets: 272 10*3/uL (ref 150–400)
RBC: 3.71 MIL/uL — ABNORMAL LOW (ref 4.22–5.81)
RDW: 12.8 % (ref 11.5–15.5)
WBC: 5.2 10*3/uL (ref 4.0–10.5)
nRBC: 0 % (ref 0.0–0.2)

## 2020-02-15 LAB — GLUCOSE, CAPILLARY: Glucose-Capillary: 132 mg/dL — ABNORMAL HIGH (ref 70–99)

## 2020-02-15 MED ORDER — LOPERAMIDE HCL 2 MG PO CAPS: 2.0000 mg | ORAL_CAPSULE | Freq: Three times a day (TID) | ORAL | 0 refills | Status: AC | PRN

## 2020-02-15 MED ORDER — POTASSIUM CHLORIDE CRYS ER 20 MEQ PO TBCR
40.0000 meq | EXTENDED_RELEASE_TABLET | Freq: Once | ORAL | Status: AC
  Administered 2020-02-15: 10:00:00 40 meq via ORAL
  Filled 2020-02-15: qty 2

## 2020-02-15 MED ORDER — PROCHLORPERAZINE MALEATE 10 MG PO TABS
10.0000 mg | ORAL_TABLET | Freq: Three times a day (TID) | ORAL | 0 refills | Status: AC | PRN
Start: 2020-02-15 — End: ?

## 2020-02-15 MED ORDER — AMOXICILLIN-POT CLAVULANATE 875-125 MG PO TABS: 1.0000 | ORAL_TABLET | Freq: Two times a day (BID) | ORAL | 0 refills | Status: AC

## 2020-02-15 MED ORDER — LEVOFLOXACIN 500 MG PO TABS
500.0000 mg | ORAL_TABLET | Freq: Every day | ORAL | 0 refills | Status: AC
Start: 2020-02-15 — End: 2020-02-29

## 2020-02-15 NOTE — TOC Progression Note (Signed)
Transition of Care [facility]) - Progression Note    Patient Details  Name: Aaron [name] MRN: [id number] Date of Birth: 1951-03-23  Transition [facility] CM/SW Contact  Joaquin Courts, RN Phone Number: 02/15/2020, 10:28 AM  Clinical Narrative:    [employer] to deliver rolling walker and 3in1 to bedside for home use.   Expected Discharge Plan: [place] Barriers to Discharge: No Barriers Identified  Expected Discharge Plan and Services Expected Discharge Plan: [place] In-house Referral: Clinical Social Work Discharge Planning Services: CM Consult Post Acute Care Choice: [place] arrangements for the past 2 months: Single Family Home Expected Discharge Date: 02/15/20               DME Arranged: Gilford Rile rolling, 3-N-1 DME Agency: [employer], [place] Date DME Agency Contacted: 02/15/20 Time DME Agency Contacted: 1027 Representative spoke with at DME Agency: [place]: RN [place] Agency: [place] Date [place]: 02/13/20 Time [place]: 1137 Representative spoke with at [place]: Cindie   Social Determinants of Health ([place]) Interventions    Readmission Risk Interventions No flowsheet data found.

## 2020-02-15 NOTE — Progress Notes (Signed)
Assessment unchanged. Pt and wife verbalized understanding of dc instructions including medications, follow up care, and when to call the doctor. All supplies for Home arrived. Wound vac dressing changed prior to dc. Discharged via wc to front entrance accompanied by nurse, wife and NT.

## 2020-02-15 NOTE — Discharge Summary (Signed)
Physician Discharge Summary   Patient ID: HILLMAN ATTIG MRN: [id number] DOB/AGE: 12/09/1950 69 y.o.  Admit date: 02/09/2020 Discharge date: 02/15/2020  Primary Care Physician:  Jacelyn Pi, MD   Recommendations for Outpatient Follow-up:  1. Follow up with PCP in 1-2 weeks 2. Wound VAC placed 3. Continue Augmentin, levofloxacin for 2 weeks, Dr. Sherryle Lis will follow the final cultures and tailor antibiotics according to sensitivities  Home Health: Home health PT OT Equipment/Devices: DME 3n 1  Discharge Condition: stable  CODE STATUS: FULL  Diet recommendation: Heart healthy diet   Discharge Diagnoses:    . Right foot abscess with surrounding cellulitis . Diabetic ulcer of right foot associated with type 2 diabetes mellitus ([place]) . Neuropathy due to type 2 diabetes mellitus ([facility]) Diabetes mellitus type 2 uncontrolled, with hyperglycemia Essential hypertension Hyperlipidemia normocytic anemia Hypokalemia  Consults: Podiatry, Dr. Sherryle Lis    Allergies:  No Known Allergies   DISCHARGE MEDICATIONS: Allergies as of 02/15/2020   No Known Allergies     Medication List    TAKE these medications   amoxicillin-clavulanate 875-125 MG tablet Commonly known as: Augmentin Take 1 tablet by mouth 2 (two) times daily for 14 days.   HYDROcodone-acetaminophen 5-325 MG tablet Commonly known as: NORCO/VICODIN Take 1 tablet by mouth every 4 (four) hours as needed.   insulin NPH-regular Human (70-30) 100 UNIT/ML injection Inject 30-50 Units into the skin See admin instructions. Inject 50 units subcutaneous in the morning and inject 30 to 50 units subcutaneous at night (depending on blood sugar reading).   Invokamet 50-1000 MG Tabs Generic drug: Canagliflozin-metFORMIN HCl Take by mouth at bedtime.   levofloxacin 500 MG tablet Commonly known as: Levaquin Take 1 tablet (500 mg total) by mouth daily for 14 days.   lisinopril-hydrochlorothiazide 20-25 MG tablet Commonly  known as: ZESTORETIC Take 1 tablet by mouth at bedtime.   loperamide 2 MG capsule Commonly known as: IMODIUM Take 1 capsule (2 mg total) by mouth every 8 (eight) hours as needed for diarrhea or loose stools. Over the counter   phenazopyridine 200 MG tablet Commonly known as: Pyridium Take 1 tablet (200 mg total) by mouth 3 (three) times daily as needed for pain.   pregabalin 50 MG capsule Commonly known as: LYRICA Take 50 mg by mouth at bedtime.   prochlorperazine 10 MG tablet Commonly known as: COMPAZINE Take 1 tablet (10 mg total) by mouth every 8 (eight) hours as needed for nausea or vomiting.   simvastatin 20 MG tablet Commonly known as: ZOCOR Take 20 mg by mouth at bedtime.            Durable Medical Equipment  (From admission, onward)         Start     Ordered   02/15/20 1028  For home use only DME Walker rolling  Once       Question Answer Comment  Walker: With [place] Wheels   Patient needs a walker to treat with the following condition Gait difficulty      02/15/20 1029   02/15/20 0627  For home use only DME 3 n 1  Once        02/15/20 0626   02/13/20 1016  For home use only DME Negative pressure wound device  Once       Question Answer Comment  Frequency of dressing change 3 times per week   Length of need 3 Months   Dressing type Foam   Amount of suction 125 mm/Hg  Pressure application Continuous pressure   Supplies 10 canisters and 15 dressings per month for duration of therapy      02/13/20 1016           Discharge Care Instructions  (From admission, onward)         Start     Ordered   02/15/20 0000  Discharge wound care:       Comments: -Continue wound VAC 125 mmHg continuous suction -First change Sunday, 02/15/2020.  Leave Adaptic and silicone layer of skin substitute graft in place, apply new sponge with windowed adhesive strips   02/15/20 0826           Brief H and P: For complete details please refer to admission H and P, but  in brief  Patient is a 69 year old male with past medical history of insulin-dependent type 2 diabetes, hypertension, hyperlipidemia, right foot diabetic ulcer for which he follows podiatry who was doing well untiltheweekend prior to [place] he began having fevers, chills and night sweats and presented to the ED on 7/25 at which time the ED physician performed an incision and drainage, gave a dose of ceftriaxone and discharged with Augmentin with podiatry follow-up. Patient presented to his podiatry officeon the day of admissionwith Dr. Sherryle Lis who noted his right foot to be warm with erythema, necrosis and purulent drainage concerning for cellulitis and abscess of his right foot and was a direct admit to [facility]. Patient stated he only picked up the Augmentin on the morning of admission and only got 1 dose of this.   Hospital Course:    Diabetic ulcer of right foot associated with type 2 diabetes mellitus ([place]), infected with surrounding cellulitis, abscess -Patient was placed on broad spectrum antibiotics with vancomycin, ceftriaxone and metronidazole -Podiatry consulted, patient underwent incision and drainage of the abscess and deep space infection below fascia 7/27.  Went for repeat OR on 7/29, wound VAC placed, continue suction 125 mmHg -PT OT evaluation today.  Per podiatry, Dr. Sherryle Lis, first change wound VAC on 8/1, with dressing changes as per note.  Home care orders and wound VAC orders placed. -Continue nonweightbearing right lower extremity.  Outpatient appointment on 02/19/2020 -Wound cultures are showing moderate Streptococcus pyogenous, abundant gram-negative rods -Placed on Augmentin for Streptococcus pyogenes and adjunctive levofloxacin to cover for gram-negative rods for 14 days.  Dr. Sherryle Lis will follow the cultures and tailor antibiotics according to sensitivities.  Patient has an appointment on 02/19/2020.   Diabetes mellitus type 2, uncontrolled with  complications/diabetic ulcer, hyperglycemia -CBGs elevated likely due to illness and surgery, at home on invokamet, NPH insulin -Continue home regimen -HbA1c 8.5  Essential hypertension -BP stable, continue amlodipine  Hyperlipidemia Continue statin.  Normocytic anemia H&H stable  Hypokalemia Resolved  Diarrhea -Resolved   Day of Discharge S: No acute complaints, hoping to go home today.  DME wound VAC arranged, will be changed prior to discharge home.  BP (!) 143/58 (BP Location: Right Arm)   Pulse 68   Temp 98 F (36.7 C) (Oral)   Resp 15   Ht 6\' 2"  (1.88 m)   Wt 89.3 kg   SpO2 95%   BMI 25.28 kg/m   Physical Exam: General: Alert and awake oriented x3 not in any acute distress. HEENT: anicteric sclera, pupils reactive to light and accommodation CVS: S1-S2 clear no murmur rubs or gallops Chest: clear to auscultation bilaterally, no wheezing rales or rhonchi Abdomen: soft nontender, nondistended, normal bowel sounds Extremities: no cyanosis, clubbing.  Right foot dressing intact with wound VAC Neuro: Cranial nerves II-XII intact, no focal neurological deficits    Get Medicines reviewed and adjusted: Please take all your medications with you for your next visit with your Primary MD  Please request your Primary MD to go over all hospital tests and procedure/radiological results at the follow up. Please ask your Primary MD to get all Hospital records sent to his/her office.  If you experience worsening of your admission symptoms, develop shortness of breath, life threatening emergency, suicidal or homicidal thoughts you must seek medical attention immediately by calling 911 or calling your MD immediately  if symptoms less severe.  You must read complete instructions/literature along with all the possible adverse reactions/side effects for all the Medicines you take and that have been prescribed to you. Take any new Medicines after you have completely understood  and accept all the possible adverse reactions/side effects.   Do not drive when taking pain medications.   Do not take more than prescribed Pain, Sleep and Anxiety Medications  Special Instructions: If you have smoked or chewed Tobacco  in the last 2 yrs please stop smoking, stop any regular Alcohol  and or any Recreational drug use.  Wear Seat belts while driving.  Please note  You were cared for by a hospitalist during your hospital stay. Once you are discharged, your primary care physician will handle any further medical issues. Please note that NO REFILLS for any discharge medications will be authorized once you are discharged, as it is imperative that you return to your primary care physician (or establish a relationship with a primary care physician if you do not have one) for your aftercare needs so that they can reassess your need for medications and monitor your lab values.   The results of significant diagnostics from this hospitalization (including imaging, microbiology, ancillary and laboratory) are listed below for reference.      Procedures/Studies:  MR FOOT RIGHT W WO CONTRAST  Result Date: 01/30/2020 CLINICAL DATA:  Diabetic foot ulcer with nonhealing blister along the plantar aspect of the 1st MTP joint for 3 weeks. EXAM: MRI OF THE RIGHT FOREFOOT WITHOUT AND WITH CONTRAST TECHNIQUE: Multiplanar, multisequence MR imaging of the right forefoot was performed before and after the administration of intravenous contrast. CONTRAST:  71mL MULTIHANCE GADOBENATE DIMEGLUMINE 529 MG/ML IV SOLN COMPARISON:  Radiographs 01/26/2020 FINDINGS: Bones/Joint/Cartilage There is advanced arthropathy at the 1st metatarsophalangeal joint with advanced joint space narrowing, osteophytes and subchondral cyst formation, especially in the base of the proximal phalanx. There is relatively mild involvement of the sesamoids. No bone destruction, marrow edema or suspicious enhancement. No significant joint  effusion. The additional metatarsophalangeal and interphalangeal joints appear normal. There are mild degenerative changes at the Lisfranc joint. Ligaments Intact Lisfranc ligament. Muscles and Tendons The forefoot tendons appear intact without significant tenosynovitis. There is mild nodular thickening of the central cord of the plantar fascia distally which may reflect mild plantar fibromatosis or inflammation. 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 No suspicious muscular enhancement. Soft tissues There is soft tissue ulceration along the plantar aspect of the medial forefoot at the level of the 1st MTP joint. There are underlying inflammatory changes and heterogeneous enhancement in the underlying subcutaneous fat, but no focal fluid collection or foreign body. IMPRESSION: 1. Soft tissue ulceration along the plantar aspect of the medial forefoot at the level of the 1st MTP joint with underlying inflammatory changes and heterogeneous enhancement in the underlying subcutaneous  fat. No evidence of abscess or foreign body. 2. No evidence of osteomyelitis. 3. 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 4. 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 Electronically Signed   By: Richardean Sale M.D.   On: 01/30/2020 11:33   DG Foot 2 Views Right  Result Date: 02/08/2020 CLINICAL DATA:  Diabetic ulceration, foot swelling EXAM: RIGHT FOOT - 2 VIEW COMPARISON:  01/26/2020 FINDINGS: Extensive soft tissue swelling involving the right forefoot has progressed since prior examination. There has also, now all, developed subcutaneous gas between the first and second digits within the interspace no associated rows of changes within the adjacent osseous structures to suggest osteomyelitis. Well-circumscribed lytic lesion within the proximal phalanx of the great toe may represent a a unicameral bone cyst or phalangeal and chondroma. Again seen is severe degenerate arthritis at the first  metatarsophalangeal joint with extensive osteophyte formation likely resulting in a hallux rigidus. Mild midfoot degenerative arthritis. Moderate plantar calcaneal spur. Vascular calcifications are noted within the soft tissues. IMPRESSION: Interval development of plantar wound with extensive soft tissue swelling involving the right forefoot and subcutaneous gas in keeping with aggressive infection. No associated osseous erosive changes. Hallux rigidus Electronically Signed   By: Fidela Salisbury MD   On: 02/08/2020 15:01   DG Foot Complete Right  Result Date: 02/02/2020 Please see detailed radiograph report in office note.      LAB RESULTS: Basic Metabolic Panel: Recent Labs  Lab 02/11/20 0514 02/12/20 0511 02/14/20 0543 02/15/20 0548  NA 135   < > 134* 138  K 3.8   < > 3.6 3.3*  CL 103   < > 104 106  CO2 22   < > 22 25  GLUCOSE 171*   < > 141* 131*  BUN 21   < > 11 13  CREATININE 0.73   < > 0.69 0.70  CALCIUM 8.6*   < > 8.3* 8.4*  MG 2.2  --   --   --    < > = values in this interval not displayed.   Liver Function Tests: No results for input(s): AST, ALT, ALKPHOS, BILITOT, PROT, ALBUMIN in the last 168 hours. No results for input(s): LIPASE, AMYLASE in the last 168 hours. No results for input(s): AMMONIA in the last 168 hours. CBC: Recent Labs  Lab 02/09/20 1409 02/10/20 0429 02/14/20 0543 02/14/20 0543 02/15/20 0548  WBC 11.6*   < > 5.9  --  5.2  NEUTROABS 10.0*  --   --   --   --   HGB 12.7*   < > 11.3*  --  10.9*  HCT 38.8*   < > 34.0*  --  32.6*  MCV 89.8   < > 87.9   < > 87.9  PLT 299   < > 246  --  272   < > = values in this interval not displayed.   Cardiac Enzymes: No results for input(s): CKTOTAL, CKMB, CKMBINDEX, TROPONINI in the last 168 hours. BNP: Invalid input(s): POCBNP CBG: Recent Labs  Lab 02/14/20 2135 02/15/20 0759  GLUCAP 150* 132*       Disposition and Follow-up: Discharge Instructions    Diet - low sodium heart healthy    Complete by: As directed    Discharge wound care:   Complete by: As directed    -Continue wound VAC 125 mmHg continuous suction -First change Sunday, 02/15/2020.  Leave Adaptic and silicone layer of skin substitute graft in place, apply new sponge with windowed adhesive strips  Increase activity slowly   Complete by: As directed         DISPOSITION: Home   DISCHARGE FOLLOW-UP  Follow-up Information    Criselda Peaches, DPM Follow up on 02/19/2020.   Specialty: Podiatry Why: please keep your appt  Contact information: [place] [address] [phone]                Time coordinating discharge:  35 minutes  Signed:   Estill Cotta M.D. [facility] 02/15/2020, 11:30 AM

## 2020-02-15 NOTE — Progress Notes (Signed)
Physical Therapy Treatment Patient Details Name: Aaron [name] MRN: [id number] DOB: [birth year] Today's Date: 02/15/2020    History of Present Illness Pt s/p I&D of R foot with wound vac placement.  Pt with hx of DM and back surgery    PT Comments    Pt in better spirits this am and tolerated ambulating increased distance in hall and negotiated stairs with rail and crutch.  Pt with improved awareness of importance of maintaining NWB on R LE.   Follow Up Recommendations  Home health PT     Equipment Recommendations  Rolling walker with 5" wheels;3in1 (PT)    Recommendations for Other Services       Precautions / Restrictions Precautions Precautions: Fall Restrictions RLE Weight Bearing: Non weight bearing    Mobility  Bed Mobility Overal bed mobility: Modified Independent             General bed mobility comments: supine<>sit with assist to manage lines only  Transfers Overall transfer level: Needs assistance Equipment used: Rolling walker (2 wheeled) Transfers: Sit to/from Stand Sit to Stand: Min guard;Supervision         General transfer comment: cues for use of UEs to self assist and LE management to insure NWB on R LE  Ambulation/Gait Ambulation/Gait assistance: Min assist Gait Distance (Feet): 20 Feet (20' twice with standing rest break) Assistive device: Rolling walker (2 wheeled) Gait Pattern/deviations: Step-to pattern;Decreased step length - right;Decreased step length - left;Shuffle Gait velocity: decr   General Gait Details: cues for posture, position from RW, sequence, and NWB on R LE   Stairs Stairs: Yes Stairs assistance: Min assist Stair Management: One rail Right;Step to pattern;Forwards;With crutches Number of Stairs: 2 General stair comments: cues for sequence and foot/crutch placement   Wheelchair Mobility    Modified Rankin (Stroke Patients Only)       Balance Overall balance assessment: Independent Sitting-balance  support: Feet supported;No upper extremity supported Sitting balance-Leahy Scale: Normal     Standing balance support: Single extremity supported Standing balance-Leahy Scale: Poor                              Cognition Arousal/Alertness: Awake/alert Behavior During Therapy: WFL for tasks assessed/performed Overall Cognitive Status: Within Functional Limits for tasks assessed                                        Exercises      General Comments        Pertinent Vitals/Pain Pain Assessment: No/denies pain    Home Living                      Prior Function            PT Goals (current goals can now be found in the care plan section) Acute Rehab PT Goals Patient Stated Goal: Foot to heal and regain IND PT Goal Formulation: With patient Time For Goal Achievement: 02/28/20 Potential to Achieve Goals: Good Progress towards PT goals: Progressing toward goals    Frequency    Min 5X/week      PT Plan Current plan remains appropriate    Co-evaluation              AM-PAC PT "6 Clicks" Mobility   Outcome Measure  Help needed turning from your back to your  side while in a flat bed without using bedrails?: None Help needed moving from lying on your back to sitting on the side of a flat bed without using bedrails?: None Help needed moving to and from a bed to a chair (including a wheelchair)?: A Little Help needed standing up from a chair using your arms (e.g., wheelchair or bedside chair)?: A Little Help needed to walk in hospital room?: A Little Help needed climbing 3-5 steps with a railing? : A Lot 6 Click Score: 19    End of Session Equipment Utilized During Treatment: Gait belt Activity Tolerance: Patient tolerated treatment well Patient left: with call bell/phone within reach;in bed;with family/visitor present Nurse Communication: Mobility status PT Visit Diagnosis: Unsteadiness on feet (R26.81);Difficulty in  walking, not elsewhere classified (R26.2);Muscle weakness (generalized) (M62.81)     Time: 5916-3846 PT Time Calculation (min) (ACUTE ONLY): 31 min  Charges:  $Gait Training: 8-22 mins $Therapeutic Activity: 8-22 mins                     Debe Coder PT Acute Rehabilitation Services Pager [phone] Office [phone]    Tonishia Steffy 02/15/2020, 12:45 PM

## 2020-02-16 LAB — SURGICAL PATHOLOGY

## 2020-02-17 DIAGNOSIS — L02611 Cutaneous abscess of right foot: Secondary | ICD-10-CM | POA: Diagnosis not present

## 2020-02-17 DIAGNOSIS — E785 Hyperlipidemia, unspecified: Secondary | ICD-10-CM | POA: Diagnosis not present

## 2020-02-17 DIAGNOSIS — Z794 Long term (current) use of insulin: Secondary | ICD-10-CM | POA: Diagnosis not present

## 2020-02-17 DIAGNOSIS — B950 Streptococcus, group A, as the cause of diseases classified elsewhere: Secondary | ICD-10-CM | POA: Diagnosis not present

## 2020-02-17 DIAGNOSIS — D649 Anemia, unspecified: Secondary | ICD-10-CM | POA: Diagnosis not present

## 2020-02-17 DIAGNOSIS — L97512 Non-pressure chronic ulcer of other part of right foot with fat layer exposed: Secondary | ICD-10-CM | POA: Diagnosis not present

## 2020-02-17 DIAGNOSIS — E1140 Type 2 diabetes mellitus with diabetic neuropathy, unspecified: Secondary | ICD-10-CM | POA: Diagnosis not present

## 2020-02-17 DIAGNOSIS — M19071 Primary osteoarthritis, right ankle and foot: Secondary | ICD-10-CM | POA: Diagnosis not present

## 2020-02-17 DIAGNOSIS — I10 Essential (primary) hypertension: Secondary | ICD-10-CM | POA: Diagnosis not present

## 2020-02-17 DIAGNOSIS — E11621 Type 2 diabetes mellitus with foot ulcer: Secondary | ICD-10-CM | POA: Diagnosis not present

## 2020-02-17 DIAGNOSIS — E1165 Type 2 diabetes mellitus with hyperglycemia: Secondary | ICD-10-CM | POA: Diagnosis not present

## 2020-02-17 DIAGNOSIS — L03031 Cellulitis of right toe: Secondary | ICD-10-CM | POA: Diagnosis not present

## 2020-02-18 DIAGNOSIS — B950 Streptococcus, group A, as the cause of diseases classified elsewhere: Secondary | ICD-10-CM | POA: Diagnosis not present

## 2020-02-18 DIAGNOSIS — L03031 Cellulitis of right toe: Secondary | ICD-10-CM | POA: Diagnosis not present

## 2020-02-18 DIAGNOSIS — E1140 Type 2 diabetes mellitus with diabetic neuropathy, unspecified: Secondary | ICD-10-CM | POA: Diagnosis not present

## 2020-02-18 DIAGNOSIS — L02611 Cutaneous abscess of right foot: Secondary | ICD-10-CM | POA: Diagnosis not present

## 2020-02-18 DIAGNOSIS — E11621 Type 2 diabetes mellitus with foot ulcer: Secondary | ICD-10-CM | POA: Diagnosis not present

## 2020-02-18 DIAGNOSIS — L97512 Non-pressure chronic ulcer of other part of right foot with fat layer exposed: Secondary | ICD-10-CM | POA: Diagnosis not present

## 2020-02-19 ENCOUNTER — Other Ambulatory Visit: Payer: Self-pay

## 2020-02-19 ENCOUNTER — Ambulatory Visit (INDEPENDENT_AMBULATORY_CARE_PROVIDER_SITE_OTHER): Payer: Medicare Other | Admitting: Podiatry

## 2020-02-19 DIAGNOSIS — M14671 Charcot's joint, right ankle and foot: Secondary | ICD-10-CM

## 2020-02-19 DIAGNOSIS — L97519 Non-pressure chronic ulcer of other part of right foot with unspecified severity: Secondary | ICD-10-CM

## 2020-02-19 DIAGNOSIS — E1140 Type 2 diabetes mellitus with diabetic neuropathy, unspecified: Secondary | ICD-10-CM | POA: Diagnosis not present

## 2020-02-19 DIAGNOSIS — L97512 Non-pressure chronic ulcer of other part of right foot with fat layer exposed: Secondary | ICD-10-CM | POA: Diagnosis not present

## 2020-02-19 DIAGNOSIS — L03031 Cellulitis of right toe: Secondary | ICD-10-CM | POA: Diagnosis not present

## 2020-02-19 DIAGNOSIS — E11621 Type 2 diabetes mellitus with foot ulcer: Secondary | ICD-10-CM | POA: Diagnosis not present

## 2020-02-19 DIAGNOSIS — B950 Streptococcus, group A, as the cause of diseases classified elsewhere: Secondary | ICD-10-CM | POA: Diagnosis not present

## 2020-02-19 DIAGNOSIS — L02611 Cutaneous abscess of right foot: Secondary | ICD-10-CM | POA: Diagnosis not present

## 2020-02-19 DIAGNOSIS — Z9889 Other specified postprocedural states: Secondary | ICD-10-CM

## 2020-02-19 NOTE — Patient Instructions (Signed)
Resume wound VAC on 02/20/20 at 129mmHg continuous pressure

## 2020-02-20 DIAGNOSIS — B950 Streptococcus, group A, as the cause of diseases classified elsewhere: Secondary | ICD-10-CM | POA: Diagnosis not present

## 2020-02-20 DIAGNOSIS — L03031 Cellulitis of right toe: Secondary | ICD-10-CM | POA: Diagnosis not present

## 2020-02-20 DIAGNOSIS — L02611 Cutaneous abscess of right foot: Secondary | ICD-10-CM | POA: Diagnosis not present

## 2020-02-20 DIAGNOSIS — E11621 Type 2 diabetes mellitus with foot ulcer: Secondary | ICD-10-CM | POA: Diagnosis not present

## 2020-02-20 DIAGNOSIS — L97512 Non-pressure chronic ulcer of other part of right foot with fat layer exposed: Secondary | ICD-10-CM | POA: Diagnosis not present

## 2020-02-20 DIAGNOSIS — E1140 Type 2 diabetes mellitus with diabetic neuropathy, unspecified: Secondary | ICD-10-CM | POA: Diagnosis not present

## 2020-02-21 NOTE — Progress Notes (Signed)
  Subjective:  Patient ID: Aaron [name], male    DOB: Dec 08, 1950,  MRN: [id number]  Chief Complaint  Patient presents with  . Routine Post Op    follow-up from hospitalization    DOS: 02/12/20  Procedure: right foot debridement and washout, sesamoidectomy, Integra and VAC application  69 y.o. male returns for post-op check. Here with his wife. He is on doxycyline and levofloxacin. Home nursing is changing his wound VAC 3x weekly. Feels well, no diarrhea or GI upset.  Review of Systems: Negative except as noted in the HPI. Denies N/V/F/Ch.   Objective:  There were no vitals filed for this visit. There is no height or weight on file to calculate BMI. Constitutional Well developed. Well nourished.  Vascular Foot warm and well perfused. Capillary refill normal to all digits.   Neurologic Normal speech. Oriented to person, place, and time.   Dermatologic No signs of infection. Integra in place with sutures. Mild peri-incision maceration. No drainage. Remaining sutured incisions healing well. Elongated mycotic toenails.  Orthopedic: No tenderness to palpation noted about the surgical site.    Assessment:   1. Post-operative state   2. Diabetic ulcer of right foot associated with type 2 diabetes mellitus, unspecified part of foot, unspecified ulcer stage ([place])   3. Charcot's joint of foot, right    Plan:  Patient was evaluated and treated and all questions answered.  S/p foot surgery right -Progressing as expected post-operatively. Integra well adhered, no signs of failure or infection - Will remove silcone layer at next visit. May require skin grafting  -WB Status: OK to WB on heel only in sx shoe -Sutures: will leave in place for another 2-3 weeks. -Medications: continue doxy/levo course to completion -Foot redressed with non adherent adaptic, DSD, tape. Incision painted with betadine for maceration. Will give 1 day VAC holiday and re-apply tomorrow by home nursing.  Lanae Crumbly, DPM 02/21/2020     Return in about 1 week (around 02/26/2020) for wound re-check.

## 2020-02-23 DIAGNOSIS — E1140 Type 2 diabetes mellitus with diabetic neuropathy, unspecified: Secondary | ICD-10-CM | POA: Diagnosis not present

## 2020-02-23 DIAGNOSIS — L02611 Cutaneous abscess of right foot: Secondary | ICD-10-CM | POA: Diagnosis not present

## 2020-02-23 DIAGNOSIS — L03031 Cellulitis of right toe: Secondary | ICD-10-CM | POA: Diagnosis not present

## 2020-02-23 DIAGNOSIS — L97512 Non-pressure chronic ulcer of other part of right foot with fat layer exposed: Secondary | ICD-10-CM | POA: Diagnosis not present

## 2020-02-23 DIAGNOSIS — B950 Streptococcus, group A, as the cause of diseases classified elsewhere: Secondary | ICD-10-CM | POA: Diagnosis not present

## 2020-02-23 DIAGNOSIS — E11621 Type 2 diabetes mellitus with foot ulcer: Secondary | ICD-10-CM | POA: Diagnosis not present

## 2020-02-23 LAB — GLUCOSE, CAPILLARY
Glucose-Capillary: 208 mg/dL — ABNORMAL HIGH (ref 70–99)
Glucose-Capillary: 144 mg/dL — ABNORMAL HIGH (ref 70–99)
Glucose-Capillary: 133 mg/dL — ABNORMAL HIGH (ref 70–99)
Glucose-Capillary: 224 mg/dL — ABNORMAL HIGH (ref 70–99)
Glucose-Capillary: 167 mg/dL — ABNORMAL HIGH (ref 70–99)
Glucose-Capillary: 194 mg/dL — ABNORMAL HIGH (ref 70–99)
Glucose-Capillary: 209 mg/dL — ABNORMAL HIGH (ref 70–99)
Glucose-Capillary: 163 mg/dL — ABNORMAL HIGH (ref 70–99)
Glucose-Capillary: 175 mg/dL — ABNORMAL HIGH (ref 70–99)
Glucose-Capillary: 164 mg/dL — ABNORMAL HIGH (ref 70–99)
Glucose-Capillary: 131 mg/dL — ABNORMAL HIGH (ref 70–99)
Glucose-Capillary: 186 mg/dL — ABNORMAL HIGH (ref 70–99)
Glucose-Capillary: 193 mg/dL — ABNORMAL HIGH (ref 70–99)

## 2020-02-25 DIAGNOSIS — B950 Streptococcus, group A, as the cause of diseases classified elsewhere: Secondary | ICD-10-CM | POA: Diagnosis not present

## 2020-02-25 DIAGNOSIS — L03031 Cellulitis of right toe: Secondary | ICD-10-CM | POA: Diagnosis not present

## 2020-02-25 DIAGNOSIS — E1140 Type 2 diabetes mellitus with diabetic neuropathy, unspecified: Secondary | ICD-10-CM | POA: Diagnosis not present

## 2020-02-25 DIAGNOSIS — L97512 Non-pressure chronic ulcer of other part of right foot with fat layer exposed: Secondary | ICD-10-CM | POA: Diagnosis not present

## 2020-02-25 DIAGNOSIS — E11621 Type 2 diabetes mellitus with foot ulcer: Secondary | ICD-10-CM | POA: Diagnosis not present

## 2020-02-25 DIAGNOSIS — L02611 Cutaneous abscess of right foot: Secondary | ICD-10-CM | POA: Diagnosis not present

## 2020-02-26 ENCOUNTER — Other Ambulatory Visit: Payer: Self-pay

## 2020-02-26 ENCOUNTER — Ambulatory Visit (INDEPENDENT_AMBULATORY_CARE_PROVIDER_SITE_OTHER): Payer: Medicare Other | Admitting: Podiatry

## 2020-02-26 DIAGNOSIS — M14671 Charcot's joint, right ankle and foot: Secondary | ICD-10-CM

## 2020-02-26 DIAGNOSIS — L97519 Non-pressure chronic ulcer of other part of right foot with unspecified severity: Secondary | ICD-10-CM

## 2020-02-26 DIAGNOSIS — Z9889 Other specified postprocedural states: Secondary | ICD-10-CM

## 2020-02-26 DIAGNOSIS — E11621 Type 2 diabetes mellitus with foot ulcer: Secondary | ICD-10-CM

## 2020-02-26 NOTE — Patient Instructions (Signed)
Wound Care Instructions   Skip [facility] application tomorrow 2/45. Apply adaptic (oiled gauze) over graft, betadine paint between toes, along margins of graft, and along plantar incision. Small amount of antibiotic ointment to dorsal foot abrasion.   Change dressing on 8/14 evening with same as above.  Can resume VAC on Monday 8/16 if maceration has improved. If still macerated along incision, margins, and between toes, can re-apply same dressing as above every other day until next appointment. Avoid VAC hose compression against dorsal foot

## 2020-02-27 DIAGNOSIS — E1140 Type 2 diabetes mellitus with diabetic neuropathy, unspecified: Secondary | ICD-10-CM | POA: Diagnosis not present

## 2020-02-27 DIAGNOSIS — E11621 Type 2 diabetes mellitus with foot ulcer: Secondary | ICD-10-CM | POA: Diagnosis not present

## 2020-02-27 DIAGNOSIS — L03031 Cellulitis of right toe: Secondary | ICD-10-CM | POA: Diagnosis not present

## 2020-02-27 DIAGNOSIS — L97512 Non-pressure chronic ulcer of other part of right foot with fat layer exposed: Secondary | ICD-10-CM | POA: Diagnosis not present

## 2020-02-27 DIAGNOSIS — L02611 Cutaneous abscess of right foot: Secondary | ICD-10-CM | POA: Diagnosis not present

## 2020-02-27 DIAGNOSIS — B950 Streptococcus, group A, as the cause of diseases classified elsewhere: Secondary | ICD-10-CM | POA: Diagnosis not present

## 2020-02-29 NOTE — Progress Notes (Signed)
  Subjective:  Patient ID: SLAYTER MOORHOUSE, male    DOB: May 25, 1951,  MRN: [id number]  Chief Complaint  Patient presents with  . Wound Check    R foot. Pt stated, "I'm feeling well. The wound vac was changed yesterday. The nurse is having difficulty applying the vac without covering the all of the toes - she is concerned about retaining moisture. The second toe seems more swollen". Pt denies fever/chills/N&V. The wound has an odor. Oral temp in the office was 99.4. Recheck = 99.6.    DOS: 02/12/20  Procedure: right foot debridement and washout, sesamoidectomy, Integra and VAC application  69 y.o. male returns for post-op check. Here with his wife. He is still on doxycyline and levofloxacin. Home nursing is changing his wound VAC 3x weekly. Feels well, no diarrhea or GI upset.  They have some concern that the skin is becoming too wet.  Review of Systems: Negative except as noted in the HPI. Denies N/V/F/Ch.   Objective:  There were no vitals filed for this visit. There is no height or weight on file to calculate BMI. Constitutional Well developed. Well nourished.  Vascular Foot warm and well perfused. Capillary refill normal to all digits.   Neurologic Normal speech. Oriented to person, place, and time.   Dermatologic No signs of infection. Integra in place with sutures. Mild peri-incision maceration. No drainage. Remaining sutured incisions healing well. Elongated mycotic toenails.  Orthopedic: No tenderness to palpation noted about the surgical site.    Assessment:   1. Post-operative state   2. Diabetic ulcer of right foot associated with type 2 diabetes mellitus, unspecified part of foot, unspecified ulcer stage ([place])   3. Charcot's joint of foot, right    Plan:  Patient was evaluated and treated and all questions answered.  S/p foot surgery right -Progressing as expected post-operatively. Integra well adhered, no signs of failure or infection.  Silicone layer left intact  today, hopeful to remove at next visit -Likely require skin grafting or xenograft in office applications for final closure -WB Status: OK to WB on heel only in sx shoe -Sutures: Some sutures removed today, remainder to remain in place -Medications: continue doxy/levo course to completion -Discontinue VAC until 8/16, if he still has maceration at that point can just stick with sterile dry dressings.  Lanae Crumbly, DPM 02/29/2020     Return in about 1 week (around 03/04/2020).

## 2020-03-01 DIAGNOSIS — B950 Streptococcus, group A, as the cause of diseases classified elsewhere: Secondary | ICD-10-CM | POA: Diagnosis not present

## 2020-03-01 DIAGNOSIS — L97512 Non-pressure chronic ulcer of other part of right foot with fat layer exposed: Secondary | ICD-10-CM | POA: Diagnosis not present

## 2020-03-01 DIAGNOSIS — E1140 Type 2 diabetes mellitus with diabetic neuropathy, unspecified: Secondary | ICD-10-CM | POA: Diagnosis not present

## 2020-03-01 DIAGNOSIS — L02611 Cutaneous abscess of right foot: Secondary | ICD-10-CM | POA: Diagnosis not present

## 2020-03-01 DIAGNOSIS — L03031 Cellulitis of right toe: Secondary | ICD-10-CM | POA: Diagnosis not present

## 2020-03-01 DIAGNOSIS — E11621 Type 2 diabetes mellitus with foot ulcer: Secondary | ICD-10-CM | POA: Diagnosis not present

## 2020-03-03 DIAGNOSIS — E1140 Type 2 diabetes mellitus with diabetic neuropathy, unspecified: Secondary | ICD-10-CM | POA: Diagnosis not present

## 2020-03-03 DIAGNOSIS — L02611 Cutaneous abscess of right foot: Secondary | ICD-10-CM | POA: Diagnosis not present

## 2020-03-03 DIAGNOSIS — E11621 Type 2 diabetes mellitus with foot ulcer: Secondary | ICD-10-CM | POA: Diagnosis not present

## 2020-03-03 DIAGNOSIS — L97512 Non-pressure chronic ulcer of other part of right foot with fat layer exposed: Secondary | ICD-10-CM | POA: Diagnosis not present

## 2020-03-03 DIAGNOSIS — L03031 Cellulitis of right toe: Secondary | ICD-10-CM | POA: Diagnosis not present

## 2020-03-03 DIAGNOSIS — B950 Streptococcus, group A, as the cause of diseases classified elsewhere: Secondary | ICD-10-CM | POA: Diagnosis not present

## 2020-03-04 ENCOUNTER — Ambulatory Visit (INDEPENDENT_AMBULATORY_CARE_PROVIDER_SITE_OTHER): Payer: Medicare Other | Admitting: Podiatry

## 2020-03-04 ENCOUNTER — Other Ambulatory Visit: Payer: Self-pay

## 2020-03-04 DIAGNOSIS — L97519 Non-pressure chronic ulcer of other part of right foot with unspecified severity: Secondary | ICD-10-CM

## 2020-03-04 DIAGNOSIS — M14671 Charcot's joint, right ankle and foot: Secondary | ICD-10-CM

## 2020-03-04 DIAGNOSIS — Z9889 Other specified postprocedural states: Secondary | ICD-10-CM

## 2020-03-04 DIAGNOSIS — E11621 Type 2 diabetes mellitus with foot ulcer: Secondary | ICD-10-CM

## 2020-03-04 DIAGNOSIS — M79676 Pain in unspecified toe(s): Secondary | ICD-10-CM

## 2020-03-04 NOTE — Progress Notes (Signed)
  Subjective:  Patient ID: Aaron [name], male    DOB: 1951/01/21,  MRN: [id number]  Chief Complaint  Patient presents with  . Wound Check    pt is here for a wound vac change of the left foot. Pt shows no signs of infectionm    DOS: 02/12/20  Procedure: right foot debridement and washout, sesamoidectomy, Integra and VAC application  70 y.o. male returns for post-op check. Here with his wife.  He has completed the antibiotic course.  They have not had the wound VAC on this week, it was discontinued due to maceration.  Review of Systems: Negative except as noted in the HPI. Denies N/V/F/Ch.   Objective:   Constitutional Well developed. Well nourished.  Vascular Foot warm and well perfused. Capillary refill normal to all digits.   Neurologic Normal speech. Oriented to person, place, and time.   Dermatologic No signs of infection. Integra in place with sutures.  Maceration has improved.  Plantar incision is well-healed now with sutures intact.  Orthopedic: No tenderness to palpation noted about the surgical site.    Assessment:   1. Post-operative state   2. Diabetic ulcer of right foot associated with type 2 diabetes mellitus, unspecified part of foot, unspecified ulcer stage ([place])   3. Charcot's joint of foot, right    Plan:  Patient was evaluated and treated and all questions answered.  S/p foot surgery right -Silicone layer of Integra was removed today.  His wound deficit now measures 5.0 cm x 2.5 cm x 0.5 cm.  There is a stable granular wound bed from the Integra application.  I discussed with him further reconstruction including skin grafting or local wound care with amniotic membrane graft in the office.  I discussed the disadvantages advantages of both, and that the disadvantage of skin grafting would be donor site morbidity as well as the fact that this is a difficult area for skin graft take to occur that there would likely still be some residual wound that would  require amniotic graft thereafter.  We also discussed that although would be outpatient surgery, he would likely need to be at the main hospital, and that with the current Covid surge that we are likely to have to decrease or cancel elective surgeries in the near future, and I do not want this to delay his wound healing. -I recommended that we pursue the amniotic graft to accelerate his wound healing and final closure so we can get back to work.  We will submit his case for authorization. -Continue local wound care, discontinue VAC therapy for now.  Sterile postop dressing of a nonadherent Adaptic and dry sterile gauze and an Ace wrap were applied. -He may be WBAT in a CAM boot with focus to put weight on the heel.  This was dispensed today.  Lanae Crumbly, DPM 03/04/2020     Return in about 1 week (around 03/11/2020) for for wound graft.

## 2020-03-04 NOTE — Patient Instructions (Addendum)
Home Health Instructions:  D/C VAC for now. Keep machine in case we restart. Can apply non-adherent dressing (adaptic), dry sterile gauze 4x4 and between toes, kerlix, and ACE wrap. May WB on heel in CAM boot. CAM boot to stay on at all times. We will apply amniotic graft at next visit

## 2020-03-05 DIAGNOSIS — E11621 Type 2 diabetes mellitus with foot ulcer: Secondary | ICD-10-CM | POA: Diagnosis not present

## 2020-03-05 DIAGNOSIS — L97512 Non-pressure chronic ulcer of other part of right foot with fat layer exposed: Secondary | ICD-10-CM | POA: Diagnosis not present

## 2020-03-05 DIAGNOSIS — L02611 Cutaneous abscess of right foot: Secondary | ICD-10-CM | POA: Diagnosis not present

## 2020-03-05 DIAGNOSIS — E1140 Type 2 diabetes mellitus with diabetic neuropathy, unspecified: Secondary | ICD-10-CM | POA: Diagnosis not present

## 2020-03-05 DIAGNOSIS — B950 Streptococcus, group A, as the cause of diseases classified elsewhere: Secondary | ICD-10-CM | POA: Diagnosis not present

## 2020-03-05 DIAGNOSIS — L03031 Cellulitis of right toe: Secondary | ICD-10-CM | POA: Diagnosis not present

## 2020-03-08 DIAGNOSIS — E11621 Type 2 diabetes mellitus with foot ulcer: Secondary | ICD-10-CM | POA: Diagnosis not present

## 2020-03-08 DIAGNOSIS — L97512 Non-pressure chronic ulcer of other part of right foot with fat layer exposed: Secondary | ICD-10-CM | POA: Diagnosis not present

## 2020-03-08 DIAGNOSIS — L02611 Cutaneous abscess of right foot: Secondary | ICD-10-CM | POA: Diagnosis not present

## 2020-03-08 DIAGNOSIS — B950 Streptococcus, group A, as the cause of diseases classified elsewhere: Secondary | ICD-10-CM | POA: Diagnosis not present

## 2020-03-08 DIAGNOSIS — E1140 Type 2 diabetes mellitus with diabetic neuropathy, unspecified: Secondary | ICD-10-CM | POA: Diagnosis not present

## 2020-03-08 DIAGNOSIS — L03031 Cellulitis of right toe: Secondary | ICD-10-CM | POA: Diagnosis not present

## 2020-03-10 DIAGNOSIS — B950 Streptococcus, group A, as the cause of diseases classified elsewhere: Secondary | ICD-10-CM | POA: Diagnosis not present

## 2020-03-10 DIAGNOSIS — L97512 Non-pressure chronic ulcer of other part of right foot with fat layer exposed: Secondary | ICD-10-CM | POA: Diagnosis not present

## 2020-03-10 DIAGNOSIS — E1140 Type 2 diabetes mellitus with diabetic neuropathy, unspecified: Secondary | ICD-10-CM | POA: Diagnosis not present

## 2020-03-10 DIAGNOSIS — L03031 Cellulitis of right toe: Secondary | ICD-10-CM | POA: Diagnosis not present

## 2020-03-10 DIAGNOSIS — L02611 Cutaneous abscess of right foot: Secondary | ICD-10-CM | POA: Diagnosis not present

## 2020-03-10 DIAGNOSIS — E11621 Type 2 diabetes mellitus with foot ulcer: Secondary | ICD-10-CM | POA: Diagnosis not present

## 2020-03-11 ENCOUNTER — Ambulatory Visit (INDEPENDENT_AMBULATORY_CARE_PROVIDER_SITE_OTHER): Payer: Medicare Other | Admitting: Podiatry

## 2020-03-11 ENCOUNTER — Other Ambulatory Visit: Payer: Self-pay

## 2020-03-11 DIAGNOSIS — M2021 Hallux rigidus, right foot: Secondary | ICD-10-CM | POA: Diagnosis not present

## 2020-03-11 DIAGNOSIS — M14671 Charcot's joint, right ankle and foot: Secondary | ICD-10-CM

## 2020-03-11 DIAGNOSIS — E11621 Type 2 diabetes mellitus with foot ulcer: Secondary | ICD-10-CM

## 2020-03-11 DIAGNOSIS — L97519 Non-pressure chronic ulcer of other part of right foot with unspecified severity: Secondary | ICD-10-CM | POA: Diagnosis not present

## 2020-03-11 DIAGNOSIS — E1140 Type 2 diabetes mellitus with diabetic neuropathy, unspecified: Secondary | ICD-10-CM | POA: Diagnosis not present

## 2020-03-11 DIAGNOSIS — Z9889 Other specified postprocedural states: Secondary | ICD-10-CM | POA: Diagnosis not present

## 2020-03-11 NOTE — Patient Instructions (Signed)
Home health:  May change no sooner than 03/15/20, change only if it has drainage, only change gauze portion, leave adaptic in place

## 2020-03-12 NOTE — Progress Notes (Signed)
  Subjective:  Patient ID: Aaron [name], male    DOB: 1951-06-20,  MRN: [id number]  Chief Complaint  Patient presents with  . Diabetic Ulcer    right foot -graft application today    DOS: 02/12/20  Procedure: right foot debridement and washout, sesamoidectomy, Integra and VAC application  69 y.o. male returns for post-op check. Here with his wife.  Here today for amniotic graft application  Review of Systems: Negative except as noted in the HPI. Denies N/V/F/Ch.   Objective:   Constitutional Well developed. Well nourished.  Vascular Foot warm and well perfused. Capillary refill normal to all digits.   Neurologic Normal speech. Oriented to person, place, and time.   Dermatologic No signs of infection.  Integra dermal matrix has been well incorporated, with now a granular wound bed.  Overall wound measurements are 5.5 cm x 3.75 cm by 0.3 cm.  Maceration and erythema has improved significantly.  Orthopedic: No tenderness to palpation noted about the surgical site.    Assessment:   1. Diabetic ulcer of right foot associated with type 2 diabetes mellitus, unspecified part of foot, unspecified ulcer stage ([place])   2. Charcot's joint of foot, right   3. Neuropathy due to type 2 diabetes mellitus (HCC)   4. Hallux rigidus of right foot   5. Post-operative state    Plan:  Patient was evaluated and treated and all questions answered.  S/p foot surgery right -Artecent amniotic graft was applied today to the healthy wound bed.  Wound measurements as above.  Dressings were applied consisting of Adaptic, dry sterile gauze dressings, and a Ace wrap under light compression. -If possible, dressing should remain intact for 1 week until the next visit.  I advised him that if the dressing has drainage they may change it no sooner than Monday, 03/15/2020 -Continue WBAT in a CAM boot with focus to put weight on the heel.    Lanae Crumbly, DPM 03/12/2020     Return in about 1 week (around  03/18/2020) for wound re-check.

## 2020-03-15 DIAGNOSIS — L02611 Cutaneous abscess of right foot: Secondary | ICD-10-CM | POA: Diagnosis not present

## 2020-03-15 DIAGNOSIS — L97512 Non-pressure chronic ulcer of other part of right foot with fat layer exposed: Secondary | ICD-10-CM | POA: Diagnosis not present

## 2020-03-15 DIAGNOSIS — B950 Streptococcus, group A, as the cause of diseases classified elsewhere: Secondary | ICD-10-CM | POA: Diagnosis not present

## 2020-03-15 DIAGNOSIS — L03031 Cellulitis of right toe: Secondary | ICD-10-CM | POA: Diagnosis not present

## 2020-03-15 DIAGNOSIS — E1140 Type 2 diabetes mellitus with diabetic neuropathy, unspecified: Secondary | ICD-10-CM | POA: Diagnosis not present

## 2020-03-15 DIAGNOSIS — E11621 Type 2 diabetes mellitus with foot ulcer: Secondary | ICD-10-CM | POA: Diagnosis not present

## 2020-03-17 DIAGNOSIS — L03031 Cellulitis of right toe: Secondary | ICD-10-CM | POA: Diagnosis not present

## 2020-03-17 DIAGNOSIS — L97512 Non-pressure chronic ulcer of other part of right foot with fat layer exposed: Secondary | ICD-10-CM | POA: Diagnosis not present

## 2020-03-17 DIAGNOSIS — B950 Streptococcus, group A, as the cause of diseases classified elsewhere: Secondary | ICD-10-CM | POA: Diagnosis not present

## 2020-03-17 DIAGNOSIS — E1140 Type 2 diabetes mellitus with diabetic neuropathy, unspecified: Secondary | ICD-10-CM | POA: Diagnosis not present

## 2020-03-17 DIAGNOSIS — E11621 Type 2 diabetes mellitus with foot ulcer: Secondary | ICD-10-CM | POA: Diagnosis not present

## 2020-03-17 DIAGNOSIS — L02611 Cutaneous abscess of right foot: Secondary | ICD-10-CM | POA: Diagnosis not present

## 2020-03-18 ENCOUNTER — Other Ambulatory Visit: Payer: Self-pay

## 2020-03-18 ENCOUNTER — Ambulatory Visit (INDEPENDENT_AMBULATORY_CARE_PROVIDER_SITE_OTHER): Payer: Medicare Other | Admitting: Podiatry

## 2020-03-18 DIAGNOSIS — M14671 Charcot's joint, right ankle and foot: Secondary | ICD-10-CM | POA: Diagnosis not present

## 2020-03-18 DIAGNOSIS — L97512 Non-pressure chronic ulcer of other part of right foot with fat layer exposed: Secondary | ICD-10-CM | POA: Diagnosis not present

## 2020-03-18 DIAGNOSIS — Z794 Long term (current) use of insulin: Secondary | ICD-10-CM | POA: Diagnosis not present

## 2020-03-18 DIAGNOSIS — E1140 Type 2 diabetes mellitus with diabetic neuropathy, unspecified: Secondary | ICD-10-CM

## 2020-03-18 DIAGNOSIS — L97519 Non-pressure chronic ulcer of other part of right foot with unspecified severity: Secondary | ICD-10-CM | POA: Diagnosis not present

## 2020-03-18 DIAGNOSIS — E1165 Type 2 diabetes mellitus with hyperglycemia: Secondary | ICD-10-CM | POA: Diagnosis not present

## 2020-03-18 DIAGNOSIS — I10 Essential (primary) hypertension: Secondary | ICD-10-CM | POA: Diagnosis not present

## 2020-03-18 DIAGNOSIS — D649 Anemia, unspecified: Secondary | ICD-10-CM | POA: Diagnosis not present

## 2020-03-18 DIAGNOSIS — T8189XD Other complications of procedures, not elsewhere classified, subsequent encounter: Secondary | ICD-10-CM

## 2020-03-18 DIAGNOSIS — L02611 Cutaneous abscess of right foot: Secondary | ICD-10-CM | POA: Diagnosis not present

## 2020-03-18 DIAGNOSIS — E11621 Type 2 diabetes mellitus with foot ulcer: Secondary | ICD-10-CM | POA: Diagnosis not present

## 2020-03-18 DIAGNOSIS — M19071 Primary osteoarthritis, right ankle and foot: Secondary | ICD-10-CM | POA: Diagnosis not present

## 2020-03-18 DIAGNOSIS — L03031 Cellulitis of right toe: Secondary | ICD-10-CM | POA: Diagnosis not present

## 2020-03-18 DIAGNOSIS — E785 Hyperlipidemia, unspecified: Secondary | ICD-10-CM | POA: Diagnosis not present

## 2020-03-18 DIAGNOSIS — B950 Streptococcus, group A, as the cause of diseases classified elsewhere: Secondary | ICD-10-CM | POA: Diagnosis not present

## 2020-03-18 NOTE — Patient Instructions (Signed)
Change the dressing on 9/4, 9/6, 9/8. Leave adaptic in place, change all the gauze. If the adaptic comes off accidentally a new one can be placed.  Call if questions/concerns

## 2020-03-22 DIAGNOSIS — L97512 Non-pressure chronic ulcer of other part of right foot with fat layer exposed: Secondary | ICD-10-CM | POA: Diagnosis not present

## 2020-03-22 DIAGNOSIS — L03031 Cellulitis of right toe: Secondary | ICD-10-CM | POA: Diagnosis not present

## 2020-03-22 DIAGNOSIS — E1140 Type 2 diabetes mellitus with diabetic neuropathy, unspecified: Secondary | ICD-10-CM | POA: Diagnosis not present

## 2020-03-22 DIAGNOSIS — B950 Streptococcus, group A, as the cause of diseases classified elsewhere: Secondary | ICD-10-CM | POA: Diagnosis not present

## 2020-03-22 DIAGNOSIS — L02611 Cutaneous abscess of right foot: Secondary | ICD-10-CM | POA: Diagnosis not present

## 2020-03-22 DIAGNOSIS — E11621 Type 2 diabetes mellitus with foot ulcer: Secondary | ICD-10-CM | POA: Diagnosis not present

## 2020-03-23 NOTE — Progress Notes (Signed)
  Subjective:  Patient ID: Aaron [name], male    DOB: May 31, 1951,  MRN: [id number]  Chief Complaint  Patient presents with  . Diabetic Ulcer      1 wk follow up wound care    69 y.o. male presents with the above complaint. History confirmed with patient.  Doing well since last visit.  They had to change the dressing themselves on Sunday because of the strikethrough.  Objective:  Physical Exam: warm, good capillary refill and normal DP and PT pulses.  Ulcer status post first graft application with mild fibrotic exudate.  After gentle debridement with a sterile ring curette reveals primarily granular wound base with overall measurements 5.5 cm x 3.0 cm x 0.3 cm.  No signs of infection      Assessment:   1. Charcot's joint of foot, right   2. Delayed surgical wound healing, subsequent encounter   3. Neuropathy due to type 2 diabetes mellitus ([place])   4. Diabetic ulcer of right foot associated with type 2 diabetes mellitus, unspecified part of foot, unspecified ulcer stage ([place])      Plan:  Patient was evaluated and treated and all questions answered.   -Artacent amniotic graft was applied today to the healthy wound bed following gentle debridement of fibrotic exudate and nonviable tissue with a #15 blade..  Wound measurements as above.  Dressings were applied consisting of surgilube Adaptic, dry sterile gauze dressings, and a Ace wrap under light compression. Marland KitchenCathey [name] had a moderate amount of drainage and had to be changed.  The may leave this dressing intact until Saturday, September 4 and then change the outer dressing and leave the Adaptic layer intact above the graft then changed every other day. -Continue WBAT in a CAM boot with focus to put weight on the heel.  Return in about 1 week (around 03/25/2020) for for new graft application.

## 2020-03-24 DIAGNOSIS — E11621 Type 2 diabetes mellitus with foot ulcer: Secondary | ICD-10-CM | POA: Diagnosis not present

## 2020-03-24 DIAGNOSIS — L03031 Cellulitis of right toe: Secondary | ICD-10-CM | POA: Diagnosis not present

## 2020-03-24 DIAGNOSIS — L02611 Cutaneous abscess of right foot: Secondary | ICD-10-CM | POA: Diagnosis not present

## 2020-03-24 DIAGNOSIS — E1140 Type 2 diabetes mellitus with diabetic neuropathy, unspecified: Secondary | ICD-10-CM | POA: Diagnosis not present

## 2020-03-24 DIAGNOSIS — B950 Streptococcus, group A, as the cause of diseases classified elsewhere: Secondary | ICD-10-CM | POA: Diagnosis not present

## 2020-03-24 DIAGNOSIS — L97512 Non-pressure chronic ulcer of other part of right foot with fat layer exposed: Secondary | ICD-10-CM | POA: Diagnosis not present

## 2020-03-25 ENCOUNTER — Other Ambulatory Visit: Payer: Self-pay

## 2020-03-25 ENCOUNTER — Ambulatory Visit (INDEPENDENT_AMBULATORY_CARE_PROVIDER_SITE_OTHER): Payer: Medicare Other | Admitting: Podiatry

## 2020-03-25 DIAGNOSIS — L97519 Non-pressure chronic ulcer of other part of right foot with unspecified severity: Secondary | ICD-10-CM | POA: Diagnosis not present

## 2020-03-25 DIAGNOSIS — Z9889 Other specified postprocedural states: Secondary | ICD-10-CM

## 2020-03-25 DIAGNOSIS — E1140 Type 2 diabetes mellitus with diabetic neuropathy, unspecified: Secondary | ICD-10-CM

## 2020-03-25 DIAGNOSIS — L97513 Non-pressure chronic ulcer of other part of right foot with necrosis of muscle: Secondary | ICD-10-CM | POA: Diagnosis not present

## 2020-03-25 DIAGNOSIS — M2021 Hallux rigidus, right foot: Secondary | ICD-10-CM

## 2020-03-25 DIAGNOSIS — E11621 Type 2 diabetes mellitus with foot ulcer: Secondary | ICD-10-CM

## 2020-03-25 DIAGNOSIS — T8189XD Other complications of procedures, not elsewhere classified, subsequent encounter: Secondary | ICD-10-CM

## 2020-03-25 DIAGNOSIS — M14671 Charcot's joint, right ankle and foot: Secondary | ICD-10-CM

## 2020-03-25 NOTE — Progress Notes (Signed)
  Subjective:  Patient ID: Aaron [name], male    DOB: 09/10/1950,  MRN: [id number]  Chief Complaint  Patient presents with  . Wound Check    pt is here for a wound check of the right foot, pt states the wound is looking a lot better, and has no other concerns.    69 y.o. male returns with the above complaint. History confirmed with patient.  Doing well since last visit.  Less drainage than last visit in the dressings.    Objective:  Physical Exam: warm, good capillary refill and normal DP and PT pulses.  Ulcer status post second graft application with minimal fibrotic exudate.  After gentle debridement with a sterile ring curette reveals primarily granular wound base with overall measurements 4.8 cm x 2.2 cm x 0.3 cm.  No signs of infection.  Plantar incision is completely healed          Assessment:   1. Delayed surgical wound healing, subsequent encounter   2. Charcot's joint of foot, right   3. Neuropathy due to type 2 diabetes mellitus ([name])   4. Diabetic ulcer of right foot associated with type 2 diabetes mellitus, unspecified part of foot, unspecified ulcer stage (H[facility])   5. Hallux rigidus of right foot   6. Post-operative state      Plan:  Patient was evaluated and treated and all questions answered.   -Artacent amniotic graft was applied today to the healthy wound bed following gentle debridement of fibrotic exudate and nonviable tissue with a #15 blade..  Wound measurements as above.  Dressings were applied consisting of surgilube Adaptic, dry sterile gauze dressings, and a Ace wrap under light compression. Marland KitchenCathey Endow had less amount of drainage this time, and I expect it to improve.  Recommend they change it Saturday Monday and Wednesday and I will see him again Thursday. -At next visit we will plan on the graft.  He is eager to go back to work.  The majority of his plantar incision is now healed.  He has an office style job that I think that it is reasonable for  him to go back to work on the Monday following the next graft. -Continue WBAT in a CAM boot with focus to put weight on the heel.  No follow-ups on file.

## 2020-03-31 DIAGNOSIS — E1140 Type 2 diabetes mellitus with diabetic neuropathy, unspecified: Secondary | ICD-10-CM | POA: Diagnosis not present

## 2020-03-31 DIAGNOSIS — B950 Streptococcus, group A, as the cause of diseases classified elsewhere: Secondary | ICD-10-CM | POA: Diagnosis not present

## 2020-03-31 DIAGNOSIS — L03031 Cellulitis of right toe: Secondary | ICD-10-CM | POA: Diagnosis not present

## 2020-03-31 DIAGNOSIS — E11621 Type 2 diabetes mellitus with foot ulcer: Secondary | ICD-10-CM | POA: Diagnosis not present

## 2020-03-31 DIAGNOSIS — L97512 Non-pressure chronic ulcer of other part of right foot with fat layer exposed: Secondary | ICD-10-CM | POA: Diagnosis not present

## 2020-03-31 DIAGNOSIS — L02611 Cutaneous abscess of right foot: Secondary | ICD-10-CM | POA: Diagnosis not present

## 2020-04-01 ENCOUNTER — Other Ambulatory Visit: Payer: Self-pay

## 2020-04-01 ENCOUNTER — Ambulatory Visit (INDEPENDENT_AMBULATORY_CARE_PROVIDER_SITE_OTHER): Payer: Medicare Other | Admitting: Podiatry

## 2020-04-01 DIAGNOSIS — E1140 Type 2 diabetes mellitus with diabetic neuropathy, unspecified: Secondary | ICD-10-CM | POA: Diagnosis not present

## 2020-04-01 DIAGNOSIS — M14671 Charcot's joint, right ankle and foot: Secondary | ICD-10-CM | POA: Diagnosis not present

## 2020-04-01 DIAGNOSIS — M2021 Hallux rigidus, right foot: Secondary | ICD-10-CM | POA: Diagnosis not present

## 2020-04-01 DIAGNOSIS — T8189XD Other complications of procedures, not elsewhere classified, subsequent encounter: Secondary | ICD-10-CM

## 2020-04-01 DIAGNOSIS — E11621 Type 2 diabetes mellitus with foot ulcer: Secondary | ICD-10-CM | POA: Diagnosis not present

## 2020-04-01 DIAGNOSIS — L97519 Non-pressure chronic ulcer of other part of right foot with unspecified severity: Secondary | ICD-10-CM

## 2020-04-03 NOTE — Progress Notes (Signed)
  Subjective:  Patient ID: Aaron [name], male    DOB: Dec 24, 1950,  MRN: [id number]  Chief Complaint  Patient presents with  . Diabetic Ulcer      1 wk follow up wound care    69 y.o. male returns with the above complaint. History confirmed with patient.  Doing well since last visit.  Less drainage than last visit in the dressings.    Objective:  Physical Exam: warm, good capillary refill and normal DP and PT pulses.  Ulcer status post second graft application with minimal fibrotic exudate.  After gentle debridement with a sterile ring curette reveals primarily granular wound base with overall measurements 4.0 cm x 2.3 cm x 0.2 cm.  No signs of infection.  Plantar incision is completely healed      Assessment:   1. Delayed surgical wound healing, subsequent encounter   2. Charcot's joint of foot, right   3. Neuropathy due to type 2 diabetes mellitus ([place])   4. Diabetic ulcer of right foot associated with type 2 diabetes mellitus, unspecified part of foot, unspecified ulcer stage ([place])   5. Hallux rigidus of right foot      Plan:  Patient was evaluated and treated and all questions answered.   -Artacent amniotic graft was applied today to the healthy wound bed following gentle debridement of fibrotic exudate and nonviable tissue with a #15 blade..  Wound measurements as above.  Dressings were applied consisting of surgilube Adaptic, dry sterile gauze dressings, and a Ace wrap under light compression. -We will not apply a graft at next week and I will see Aaron [name] in 2 weeks for reapplication -His plantar wound has healed and he may begin limited WBAT in a CAM boot with focus towards weight on the heel.  He may resume partial work next week, they will be able to accommodate Aaron [name] to have reduced activity   No follow-ups on file.

## 2020-04-05 DIAGNOSIS — L03031 Cellulitis of right toe: Secondary | ICD-10-CM | POA: Diagnosis not present

## 2020-04-05 DIAGNOSIS — B950 Streptococcus, group A, as the cause of diseases classified elsewhere: Secondary | ICD-10-CM | POA: Diagnosis not present

## 2020-04-05 DIAGNOSIS — L97512 Non-pressure chronic ulcer of other part of right foot with fat layer exposed: Secondary | ICD-10-CM | POA: Diagnosis not present

## 2020-04-05 DIAGNOSIS — E1140 Type 2 diabetes mellitus with diabetic neuropathy, unspecified: Secondary | ICD-10-CM | POA: Diagnosis not present

## 2020-04-05 DIAGNOSIS — E11621 Type 2 diabetes mellitus with foot ulcer: Secondary | ICD-10-CM | POA: Diagnosis not present

## 2020-04-05 DIAGNOSIS — L02611 Cutaneous abscess of right foot: Secondary | ICD-10-CM | POA: Diagnosis not present

## 2020-04-15 ENCOUNTER — Other Ambulatory Visit: Payer: Self-pay

## 2020-04-15 ENCOUNTER — Ambulatory Visit (INDEPENDENT_AMBULATORY_CARE_PROVIDER_SITE_OTHER): Payer: Medicare Other | Admitting: Podiatry

## 2020-04-15 DIAGNOSIS — M2021 Hallux rigidus, right foot: Secondary | ICD-10-CM | POA: Diagnosis not present

## 2020-04-15 DIAGNOSIS — M14671 Charcot's joint, right ankle and foot: Secondary | ICD-10-CM

## 2020-04-15 DIAGNOSIS — L97519 Non-pressure chronic ulcer of other part of right foot with unspecified severity: Secondary | ICD-10-CM

## 2020-04-15 DIAGNOSIS — Z9889 Other specified postprocedural states: Secondary | ICD-10-CM | POA: Diagnosis not present

## 2020-04-15 DIAGNOSIS — E11621 Type 2 diabetes mellitus with foot ulcer: Secondary | ICD-10-CM | POA: Diagnosis not present

## 2020-04-15 DIAGNOSIS — E1140 Type 2 diabetes mellitus with diabetic neuropathy, unspecified: Secondary | ICD-10-CM | POA: Diagnosis not present

## 2020-04-15 DIAGNOSIS — T8189XD Other complications of procedures, not elsewhere classified, subsequent encounter: Secondary | ICD-10-CM

## 2020-04-15 NOTE — Progress Notes (Signed)
  Subjective:  Patient ID: Aaron [name], male    DOB: Sep 16, 1950,  MRN: [id number]    69 y.o. male returns for evaluation of delayed wound healing and amniotic graft application #4. History confirmed with patient.  Doing well since last visit.  Now with minimal drainage.  His wife change dressing once.  He is back to work now part-time  Objective:  Physical Exam: warm, good capillary refill and normal DP and PT pulses.  Ulcer status post second graft application with minimal fibrotic exudate.  After gentle debridement with a sterile ring curette reveals primarily granular wound base with overall measurements 1.1 cm x 1.5 cm x 0.2 cm.  No signs of infection.  Plantar incision remains healed.  Significant improvement since last visit 2 weeks ago     Assessment:   1. Delayed surgical wound healing, subsequent encounter   2. Charcot's joint of foot, right   3. Neuropathy due to type 2 diabetes mellitus ([place])   4. Diabetic ulcer of right foot associated with type 2 diabetes mellitus, unspecified part of foot, unspecified ulcer stage ([facility])   5. Hallux rigidus of right foot   6. Post-operative state      Plan:  Patient was evaluated and treated and all questions answered.   -Artacent amniotic graft was applied today to the healthy wound bed following gentle debridement of fibrotic exudate and nonviable tissue with a #15 blade.  Wound measurements as above.  Dressings were applied consisting of Adaptic, dry sterile gauze dressings, and light compression with [place]. -Continue WBAT now in surgical shoe. -Hopefully will be fully healed by next week and can resume regular shoe gear   Return in about 1 week (around 04/22/2020).

## 2020-04-22 ENCOUNTER — Other Ambulatory Visit: Payer: Self-pay

## 2020-04-22 ENCOUNTER — Ambulatory Visit: Payer: Medicare Other | Admitting: Podiatry

## 2020-04-22 ENCOUNTER — Ambulatory Visit (INDEPENDENT_AMBULATORY_CARE_PROVIDER_SITE_OTHER): Payer: Medicare Other | Admitting: Podiatry

## 2020-04-22 DIAGNOSIS — M14671 Charcot's joint, right ankle and foot: Secondary | ICD-10-CM

## 2020-04-22 DIAGNOSIS — T8189XD Other complications of procedures, not elsewhere classified, subsequent encounter: Secondary | ICD-10-CM

## 2020-04-22 DIAGNOSIS — L97519 Non-pressure chronic ulcer of other part of right foot with unspecified severity: Secondary | ICD-10-CM

## 2020-04-22 DIAGNOSIS — E1140 Type 2 diabetes mellitus with diabetic neuropathy, unspecified: Secondary | ICD-10-CM

## 2020-04-22 DIAGNOSIS — E11621 Type 2 diabetes mellitus with foot ulcer: Secondary | ICD-10-CM

## 2020-04-23 ENCOUNTER — Encounter: Payer: Self-pay | Admitting: Podiatry

## 2020-04-23 NOTE — Progress Notes (Signed)
  Subjective:  Patient ID: Aaron [name], male    DOB: [birth year],  MRN: [id number]    69 y.o. male returns for evaluation of delayed wound healing and amniotic graft application #4. History confirmed with patient.  Doing well since last visit.  No drainage. Wound has healed. Back at work 1/2 days.  Objective:  Physical Exam: warm, good capillary refill and normal DP and PT pulses.  Wound has healed with graft well incorporated.     Assessment:   1. Delayed surgical wound healing, subsequent encounter   2. Charcot's joint of foot, right   3. Neuropathy due to type 2 diabetes mellitus ([place])   4. Diabetic ulcer of right foot associated with type 2 diabetes mellitus, unspecified part of foot, unspecified ulcer stage ([place])      Plan:  Patient was evaluated and treated and all questions answered.   -Transition to WBAT in his regular shoe gear -Continue gradual return to work - May begin bathing - Return in 2.5 weeks for re-check. If any wound recurs or regresses, call for ASAP appt   Return in about 19 days (around 05/11/2020) for wound re-check.

## 2020-04-25 DIAGNOSIS — Z23 Encounter for immunization: Secondary | ICD-10-CM | POA: Diagnosis not present

## 2020-04-29 ENCOUNTER — Ambulatory Visit (INDEPENDENT_AMBULATORY_CARE_PROVIDER_SITE_OTHER): Payer: Medicare Other

## 2020-04-29 ENCOUNTER — Other Ambulatory Visit: Payer: Self-pay

## 2020-04-29 ENCOUNTER — Ambulatory Visit (INDEPENDENT_AMBULATORY_CARE_PROVIDER_SITE_OTHER): Payer: Medicare Other | Admitting: Podiatry

## 2020-04-29 ENCOUNTER — Other Ambulatory Visit: Payer: Self-pay | Admitting: Podiatry

## 2020-04-29 ENCOUNTER — Encounter: Payer: Self-pay | Admitting: Podiatry

## 2020-04-29 DIAGNOSIS — L02619 Cutaneous abscess of unspecified foot: Secondary | ICD-10-CM | POA: Diagnosis not present

## 2020-04-29 DIAGNOSIS — E1140 Type 2 diabetes mellitus with diabetic neuropathy, unspecified: Secondary | ICD-10-CM

## 2020-04-29 DIAGNOSIS — M14671 Charcot's joint, right ankle and foot: Secondary | ICD-10-CM | POA: Diagnosis not present

## 2020-04-29 DIAGNOSIS — T8189XD Other complications of procedures, not elsewhere classified, subsequent encounter: Secondary | ICD-10-CM | POA: Diagnosis not present

## 2020-04-29 DIAGNOSIS — L03119 Cellulitis of unspecified part of limb: Secondary | ICD-10-CM | POA: Diagnosis not present

## 2020-04-29 DIAGNOSIS — M2021 Hallux rigidus, right foot: Secondary | ICD-10-CM

## 2020-04-29 MED ORDER — AMOXICILLIN-POT CLAVULANATE 875-125 MG PO TABS
1.0000 | ORAL_TABLET | Freq: Two times a day (BID) | ORAL | 0 refills | Status: AC
Start: 2020-04-29 — End: 2020-05-13

## 2020-04-29 NOTE — Progress Notes (Signed)
  Subjective:  Patient ID: Aaron [name], male    DOB: [birth year],  MRN: [id number]  Chief Complaint  Patient presents with  . Wound Check    Pt states he noticed the swelling and reddness yesterday- pt came in today for check up      69 y.o. male returns for urgent visit he noticed significant swelling, redness and pain coming around the toe and the wound.  This began yesterday.  Otherwise he has been doing well and he has been weightbearing as tolerated in regular shoes.  Objective:  Physical Exam: warm, good capillary refill and normal DP and PT pulses.  Wound has healed, there is a small area of scab in the interspace.  Only superficial area is open on debridement of this.  No deep wound.  There is erythema and edema about the hallux to the level of the metatarsal head.  No drainage or malodor.     Assessment:   1. Delayed surgical wound healing, subsequent encounter   2. Charcot's joint of foot, right   3. Neuropathy due to type 2 diabetes mellitus (HCC)   4. Hallux rigidus of right foot   5. Cellulitis and abscess of foot, except toes      Plan:  Patient was evaluated and treated and all questions answered.   -Today he had significant swelling, erythema and has had some pain.  I think likely this is a mild cellulitis that has developed from the small area of superficial delayed healing that is present in the interspace beneath the scab that was not present on last exam.  Differential diagnosis includes acute Charcot which she has previously had in the first metatarsophalangeal joint.  His radiographs today did not indicate any progressive change of this since his last exam.    I have ordered lab work to evaluate this including CBC, ESR, CRP, procalcitonin.    Recommend that he go back into his CAM boot.  He may be WBAT in this but should rest as much as possible.  Rx for Augmentin sent to his pharmacy  We we will reevaluate at his next visit on 10/26 with new  radiographs   No follow-ups on file.

## 2020-04-30 LAB — CBC WITH DIFFERENTIAL/PLATELET
Basophils Absolute: 0 10*3/uL (ref 0.0–0.2)
Basos: 0 %
EOS (ABSOLUTE): 0.1 10*3/uL (ref 0.0–0.4)
Eos: 2 %
Hematocrit: 41.9 % (ref 37.5–51.0)
Hemoglobin: 13.8 g/dL (ref 13.0–17.7)
Immature Grans (Abs): 0 10*3/uL (ref 0.0–0.1)
Immature Granulocytes: 0 %
Lymphocytes Absolute: 1.1 10*3/uL (ref 0.7–3.1)
Lymphs: 20 %
MCH: 28.6 pg (ref 26.6–33.0)
MCHC: 32.9 g/dL (ref 31.5–35.7)
MCV: 87 fL (ref 79–97)
Monocytes Absolute: 0.4 10*3/uL (ref 0.1–0.9)
Monocytes: 8 %
Neutrophils Absolute: 3.7 10*3/uL (ref 1.4–7.0)
Neutrophils: 70 %
Platelets: 238 10*3/uL (ref 150–450)
RBC: 4.83 x10E6/uL (ref 4.14–5.80)
RDW: 13.2 % (ref 11.6–15.4)
WBC: 5.3 10*3/uL (ref 3.4–10.8)

## 2020-04-30 LAB — SEDIMENTATION RATE: Sed Rate: 13 mm/hr (ref 0–30)

## 2020-04-30 LAB — PROCALCITONIN: Procalcitonin: 0.05 ng/mL (ref 0.00–0.08)

## 2020-04-30 LAB — C-REACTIVE PROTEIN: CRP: 5 mg/L (ref 0–10)

## 2020-05-03 ENCOUNTER — Encounter: Payer: Self-pay | Admitting: Podiatry

## 2020-05-11 ENCOUNTER — Ambulatory Visit (INDEPENDENT_AMBULATORY_CARE_PROVIDER_SITE_OTHER): Payer: Medicare Other

## 2020-05-11 ENCOUNTER — Ambulatory Visit (INDEPENDENT_AMBULATORY_CARE_PROVIDER_SITE_OTHER): Payer: Medicare Other | Admitting: Podiatry

## 2020-05-11 ENCOUNTER — Other Ambulatory Visit: Payer: Self-pay

## 2020-05-11 DIAGNOSIS — T8189XD Other complications of procedures, not elsewhere classified, subsequent encounter: Secondary | ICD-10-CM | POA: Diagnosis not present

## 2020-05-11 DIAGNOSIS — M2021 Hallux rigidus, right foot: Secondary | ICD-10-CM

## 2020-05-11 DIAGNOSIS — E1140 Type 2 diabetes mellitus with diabetic neuropathy, unspecified: Secondary | ICD-10-CM

## 2020-05-11 DIAGNOSIS — M14671 Charcot's joint, right ankle and foot: Secondary | ICD-10-CM

## 2020-05-11 NOTE — Progress Notes (Signed)
  Subjective:  Patient ID: Aaron [name], male    DOB: October 25, 1950,  MRN: [id number]  Chief Complaint  Patient presents with  . Wound Check    PT stated that he has no concerns and he doesnt have any pain at this time      69 y.o. male returns for follow-up, redness resolved with antibiotics, he is back in the CAM boot.  Objective:  Physical Exam: warm, good capillary refill and normal DP and PT pulses.  Wound is limited to a small superficial area plantar lateral hallux, partial thickness.  No deep wound.  No erythema, malodor, purulence, or drainage   Labs 10/14 unremarkable for infection Assessment:   1. Delayed surgical wound healing, subsequent encounter      Plan:  Patient was evaluated and treated and all questions answered.   -Still unclear if previous episode was cellulitis or Charcot flare, regardless has resolved.  -Given small area of partial thickness ulcer, would re-apply antibiotic ointment and light bandaid. May consider amnion if still open next visit  -Continue CAM boot WBAT hopefully can return to shoes then    Return in about 3 weeks (around 06/01/2020).

## 2020-05-12 ENCOUNTER — Encounter: Payer: Self-pay | Admitting: Podiatry

## 2020-05-13 DIAGNOSIS — Z23 Encounter for immunization: Secondary | ICD-10-CM | POA: Diagnosis not present

## 2020-06-01 ENCOUNTER — Other Ambulatory Visit: Payer: Self-pay

## 2020-06-01 ENCOUNTER — Encounter: Payer: Self-pay | Admitting: Podiatry

## 2020-06-01 ENCOUNTER — Ambulatory Visit (INDEPENDENT_AMBULATORY_CARE_PROVIDER_SITE_OTHER): Payer: Medicare Other | Admitting: Podiatry

## 2020-06-01 DIAGNOSIS — M79675 Pain in left toe(s): Secondary | ICD-10-CM

## 2020-06-01 DIAGNOSIS — M79674 Pain in right toe(s): Secondary | ICD-10-CM | POA: Diagnosis not present

## 2020-06-01 DIAGNOSIS — M14671 Charcot's joint, right ankle and foot: Secondary | ICD-10-CM

## 2020-06-01 DIAGNOSIS — E1140 Type 2 diabetes mellitus with diabetic neuropathy, unspecified: Secondary | ICD-10-CM

## 2020-06-01 DIAGNOSIS — B351 Tinea unguium: Secondary | ICD-10-CM

## 2020-06-01 DIAGNOSIS — T8189XD Other complications of procedures, not elsewhere classified, subsequent encounter: Secondary | ICD-10-CM | POA: Diagnosis not present

## 2020-06-01 DIAGNOSIS — M2021 Hallux rigidus, right foot: Secondary | ICD-10-CM

## 2020-06-01 NOTE — Progress Notes (Signed)
  Subjective:  Patient ID: Aaron [name], male    DOB: 12-02-1950,  MRN: [id number]  Chief Complaint  Patient presents with  . Wound Check    PT stated that he is doing good and denies any pain at this time and has no concerns      69 y.o. male returns for follow-up, his back and diabetic shoes.  He has had no issues since last visit.  There are some dried skin and callus on the foot but they do not peel it back.  His nails are also thickened and elongated. Objective:  Physical Exam: warm, good capillary refill and normal DP and PT pulses.  Small area of hyperkeratosis, on debridement the wound has completely healed.  There is onychomycosis of the 5 toes of the right foot   Assessment:   1. Delayed surgical wound healing, subsequent encounter   2. Charcot's joint of foot, right   3. Neuropathy due to type 2 diabetes mellitus (HCC)   4. Hallux rigidus of right foot      Plan:  Patient was evaluated and treated and all questions answered.   -Ulcer remains healed.  I encouraged him to continue bathing and applying antibiotic ointment for another 2 weeks or so and then only lotion -I would like to see him once more in 1 month to ensure that the ulcer has not recurred and we will get him on a 65-month regular surveillance schedule for at risk diabetic foot care  -Nails x5 were debrided and reduced in length and thickness using a sharp nail nipper to his tolerance   Return in about 1 month (around 07/01/2020).

## 2020-06-14 DIAGNOSIS — N529 Male erectile dysfunction, unspecified: Secondary | ICD-10-CM | POA: Diagnosis not present

## 2020-06-14 DIAGNOSIS — E11319 Type 2 diabetes mellitus with unspecified diabetic retinopathy without macular edema: Secondary | ICD-10-CM | POA: Diagnosis not present

## 2020-06-14 DIAGNOSIS — E1165 Type 2 diabetes mellitus with hyperglycemia: Secondary | ICD-10-CM | POA: Diagnosis not present

## 2020-06-14 DIAGNOSIS — I10 Essential (primary) hypertension: Secondary | ICD-10-CM | POA: Diagnosis not present

## 2020-06-14 DIAGNOSIS — E1140 Type 2 diabetes mellitus with diabetic neuropathy, unspecified: Secondary | ICD-10-CM | POA: Diagnosis not present

## 2020-06-14 DIAGNOSIS — E669 Obesity, unspecified: Secondary | ICD-10-CM | POA: Diagnosis not present

## 2020-06-14 DIAGNOSIS — E7800 Pure hypercholesterolemia, unspecified: Secondary | ICD-10-CM | POA: Diagnosis not present

## 2020-07-01 ENCOUNTER — Ambulatory Visit (INDEPENDENT_AMBULATORY_CARE_PROVIDER_SITE_OTHER): Payer: Medicare Other | Admitting: Podiatry

## 2020-07-01 ENCOUNTER — Other Ambulatory Visit: Payer: Self-pay

## 2020-07-01 DIAGNOSIS — L84 Corns and callosities: Secondary | ICD-10-CM

## 2020-07-01 DIAGNOSIS — T8189XD Other complications of procedures, not elsewhere classified, subsequent encounter: Secondary | ICD-10-CM | POA: Diagnosis not present

## 2020-07-01 DIAGNOSIS — M14671 Charcot's joint, right ankle and foot: Secondary | ICD-10-CM | POA: Diagnosis not present

## 2020-07-01 DIAGNOSIS — E1140 Type 2 diabetes mellitus with diabetic neuropathy, unspecified: Secondary | ICD-10-CM

## 2020-07-03 ENCOUNTER — Encounter: Payer: Self-pay | Admitting: Podiatry

## 2020-07-03 NOTE — Progress Notes (Signed)
  Subjective:  Patient ID: Aaron [name], male    DOB: [birth year],  MRN: [id number]     69 y.o. male returns for follow-up for a wound check from grafting of the interspace of the right foot from a diabetic foot infection.  There is a small callused area on the fifth toe that is new.  He is in regular shoe gear.  He is back at work about 30 hours a week on his feet weightbearing as tolerated. Objective:  Physical Exam: warm, good capillary refill and normal DP and PT pulses.  Small area of hyperkeratosis in the first interspace, after debridement the wound remains completely healed.  There is a small hyperkeratosis of preulcerative callus on the dorsal PIPJ of the fifth toe, no wound underlying after debridement   Assessment:   1. Delayed surgical wound healing, subsequent encounter   2. Charcot's joint of foot, right   3. Neuropathy due to type 2 diabetes mellitus (HCC)   4. Callus of foot      Plan:  Patient was evaluated and treated and all questions answered.   -Ulcer remains healed.  -Debrided the hyperkeratotic lesion on the fifth toe and dispensed a silicone toe pad -Return in 2 months for regular at risk diabetic foot care and then we will resume 37-month visits after that for surveillance.   Return in about 2 months (around 09/01/2020) for at risk diabetic foot care.

## 2020-09-02 ENCOUNTER — Other Ambulatory Visit: Payer: Self-pay

## 2020-09-02 ENCOUNTER — Ambulatory Visit (INDEPENDENT_AMBULATORY_CARE_PROVIDER_SITE_OTHER): Payer: Medicare Other | Admitting: Podiatry

## 2020-09-02 DIAGNOSIS — M79674 Pain in right toe(s): Secondary | ICD-10-CM | POA: Diagnosis not present

## 2020-09-02 DIAGNOSIS — M79675 Pain in left toe(s): Secondary | ICD-10-CM | POA: Diagnosis not present

## 2020-09-02 DIAGNOSIS — E1140 Type 2 diabetes mellitus with diabetic neuropathy, unspecified: Secondary | ICD-10-CM | POA: Diagnosis not present

## 2020-09-02 DIAGNOSIS — B351 Tinea unguium: Secondary | ICD-10-CM | POA: Diagnosis not present

## 2020-09-02 DIAGNOSIS — M14671 Charcot's joint, right ankle and foot: Secondary | ICD-10-CM

## 2020-09-05 ENCOUNTER — Encounter: Payer: Self-pay | Admitting: Podiatry

## 2020-09-05 NOTE — Progress Notes (Signed)
  Subjective:  Patient ID: Aaron [name], male    DOB: September 18, 1950,  MRN: [id number]     70 y.o. male returns for follow-up for a wound check from grafting of the interspace of the right foot from a diabetic foot infection.  He is doing quite well.  He is back at work full-time.  Reports no new ulceration.  He has elongated thickened toenails that are difficult for him to cut.  Objective:  Physical Exam: warm, good capillary refill and normal DP and PT pulses.  Small area of hyperkeratosis in the first interspace, after debridement the wound remains completely healed.  Onychomycosis with thickened elongated nail plates x10 with yellow toenails and subungual debris  Assessment:   No diagnosis found.   Plan:  Patient was evaluated and treated and all questions answered.   Patient educated on diabetes. Discussed proper diabetic foot care and discussed risks and complications of disease. Educated patient in depth on reasons to return to the office immediately should he/she discover anything concerning or new on the feet. All questions answered. Discussed proper shoes as well.   Thankfully he remains ulcer free  Discussed the etiology and treatment options for the condition in detail with the patient. Educated patient on the topical and oral treatment options for mycotic nails. Recommended debridement of the nails today. Sharp and mechanical debridement performed of all painful and mycotic nails today. Nails debrided in length and thickness using a nail nipper to level of comfort. Discussed treatment options including appropriate shoe gear. Follow up as needed for painful nails.     Return in about 3 months (around 11/30/2020) for at risk diabetic foot care.

## 2020-12-02 ENCOUNTER — Other Ambulatory Visit: Payer: Self-pay

## 2020-12-02 ENCOUNTER — Encounter: Payer: Self-pay | Admitting: Podiatry

## 2020-12-02 ENCOUNTER — Ambulatory Visit (INDEPENDENT_AMBULATORY_CARE_PROVIDER_SITE_OTHER): Payer: Medicare Other | Admitting: Podiatry

## 2020-12-02 DIAGNOSIS — M79674 Pain in right toe(s): Secondary | ICD-10-CM

## 2020-12-02 DIAGNOSIS — E1140 Type 2 diabetes mellitus with diabetic neuropathy, unspecified: Secondary | ICD-10-CM

## 2020-12-02 DIAGNOSIS — E11610 Type 2 diabetes mellitus with diabetic neuropathic arthropathy: Secondary | ICD-10-CM

## 2020-12-02 DIAGNOSIS — M14671 Charcot's joint, right ankle and foot: Secondary | ICD-10-CM

## 2020-12-02 DIAGNOSIS — L84 Corns and callosities: Secondary | ICD-10-CM | POA: Diagnosis not present

## 2020-12-02 DIAGNOSIS — M79675 Pain in left toe(s): Secondary | ICD-10-CM | POA: Diagnosis not present

## 2020-12-02 DIAGNOSIS — B351 Tinea unguium: Secondary | ICD-10-CM | POA: Diagnosis not present

## 2020-12-05 NOTE — Progress Notes (Signed)
  Subjective:  Patient ID: Aaron [name], male    DOB: Apr 14, 1951,  MRN: [id number]     70 y.o. male returns for follow-up for a wound check from grafting of the interspace of the right foot from a diabetic foot infection.  He is doing quite well.  He is back at work full-time.  Reports no new ulceration.  He has elongated thickened toenails that are difficult for him to cut.  Objective:  Physical Exam: warm, good capillary refill and normal DP and PT pulses.  Small area of hyperkeratosis in the first interspace, after debridement the wound remains completely healed.  Onychomycosis with thickened elongated nail plates x10 with yellow toenails and subungual debris  Assessment:   1. Pain due to onychomycosis of toenails of both feet   2. Charcot's joint of foot, right   3. Neuropathy due to type 2 diabetes mellitus (HCC)   4. Callus of foot   5. Type 2 diabetes mellitus with diabetic neuropathic arthropathy, without long-term current use of insulin ([place])      Plan:  Patient was evaluated and treated and all questions answered.   Patient educated on diabetes. Discussed proper diabetic foot care and discussed risks and complications of disease. Educated patient in depth on reasons to return to the office immediately should he/she discover anything concerning or new on the feet. All questions answered. Discussed proper shoes as well.   Thankfully he remains ulcer free  All symptomatic hyperkeratoses were safely debrided with a sterile #15 blade to patient's level of comfort without incident. We discussed preventative and palliative care of these lesions including supportive and accommodative shoegear, padding, prefabricated and custom molded accommodative orthoses, use of a pumice stone and lotions/creams daily.   Discussed the etiology and treatment options for the condition in detail with the patient. Educated patient on the topical and oral treatment options for mycotic nails.  Recommended debridement of the nails today. Sharp and mechanical debridement performed of all painful and mycotic nails today. Nails debrided in length and thickness using a nail nipper to level of comfort. Discussed treatment options including appropriate shoe gear. Follow up as needed for painful nails.     Return in about 3 months (around 03/04/2021) for at risk diabetic foot care.

## 2020-12-07 DIAGNOSIS — E7801 Familial hypercholesterolemia: Secondary | ICD-10-CM | POA: Diagnosis not present

## 2020-12-07 DIAGNOSIS — E1165 Type 2 diabetes mellitus with hyperglycemia: Secondary | ICD-10-CM | POA: Diagnosis not present

## 2020-12-14 DIAGNOSIS — E11319 Type 2 diabetes mellitus with unspecified diabetic retinopathy without macular edema: Secondary | ICD-10-CM | POA: Diagnosis not present

## 2020-12-14 DIAGNOSIS — E1140 Type 2 diabetes mellitus with diabetic neuropathy, unspecified: Secondary | ICD-10-CM | POA: Diagnosis not present

## 2020-12-14 DIAGNOSIS — E669 Obesity, unspecified: Secondary | ICD-10-CM | POA: Diagnosis not present

## 2020-12-14 DIAGNOSIS — E7800 Pure hypercholesterolemia, unspecified: Secondary | ICD-10-CM | POA: Diagnosis not present

## 2020-12-14 DIAGNOSIS — E1165 Type 2 diabetes mellitus with hyperglycemia: Secondary | ICD-10-CM | POA: Diagnosis not present

## 2020-12-14 DIAGNOSIS — I10 Essential (primary) hypertension: Secondary | ICD-10-CM | POA: Diagnosis not present

## 2021-03-08 ENCOUNTER — Other Ambulatory Visit: Payer: Self-pay

## 2021-03-08 ENCOUNTER — Ambulatory Visit (INDEPENDENT_AMBULATORY_CARE_PROVIDER_SITE_OTHER): Payer: Medicare Other | Admitting: Podiatry

## 2021-03-08 DIAGNOSIS — M79675 Pain in left toe(s): Secondary | ICD-10-CM

## 2021-03-08 DIAGNOSIS — B351 Tinea unguium: Secondary | ICD-10-CM

## 2021-03-08 DIAGNOSIS — E11610 Type 2 diabetes mellitus with diabetic neuropathic arthropathy: Secondary | ICD-10-CM

## 2021-03-08 DIAGNOSIS — M79674 Pain in right toe(s): Secondary | ICD-10-CM

## 2021-03-13 NOTE — Progress Notes (Signed)
  Subjective:  Patient ID: Aaron [name], male    DOB: 1951-05-01,  MRN: [id number]     70 y.o. male returns for follow-up for a wound check from grafting of the interspace of the right foot from a diabetic foot infection.  He is doing quite well.  He is back at work full-time.  Reports no new ulceration.  He has elongated thickened toenails that are difficult for him to cut.  Objective:  Physical Exam: warm, good capillary refill and normal DP and PT pulses.   first interspace wound remains completely healed.  Onychomycosis with thickened elongated nail plates x10 with yellow toenails and subungual debris  Assessment:   1. Pain due to onychomycosis of toenails of both feet   2. Type 2 diabetes mellitus with diabetic neuropathic arthropathy, without long-term current use of insulin ([place])       Plan:  Patient was evaluated and treated and all questions answered.   Patient educated on diabetes. Discussed proper diabetic foot care and discussed risks and complications of disease. Educated patient in depth on reasons to return to the office immediately should he/she discover anything concerning or new on the feet. All questions answered. Discussed proper shoes as well.   Thankfully he remains ulcer free   Discussed the etiology and treatment options for the condition in detail with the patient. Educated patient on the topical and oral treatment options for mycotic nails. Recommended debridement of the nails today. Sharp and mechanical debridement performed of all painful and mycotic nails today. Nails debrided in length and thickness using a nail nipper to level of comfort. Discussed treatment options including appropriate shoe gear. Follow up as needed for painful nails.     Return in about 11 weeks (around 05/24/2021).

## 2021-05-06 DIAGNOSIS — E1165 Type 2 diabetes mellitus with hyperglycemia: Secondary | ICD-10-CM | POA: Diagnosis not present

## 2021-05-06 DIAGNOSIS — E1140 Type 2 diabetes mellitus with diabetic neuropathy, unspecified: Secondary | ICD-10-CM | POA: Diagnosis not present

## 2021-05-06 DIAGNOSIS — I10 Essential (primary) hypertension: Secondary | ICD-10-CM | POA: Diagnosis not present

## 2021-05-06 DIAGNOSIS — E11319 Type 2 diabetes mellitus with unspecified diabetic retinopathy without macular edema: Secondary | ICD-10-CM | POA: Diagnosis not present

## 2021-05-06 DIAGNOSIS — Z23 Encounter for immunization: Secondary | ICD-10-CM | POA: Diagnosis not present

## 2021-05-06 DIAGNOSIS — E7800 Pure hypercholesterolemia, unspecified: Secondary | ICD-10-CM | POA: Diagnosis not present

## 2021-05-06 DIAGNOSIS — N529 Male erectile dysfunction, unspecified: Secondary | ICD-10-CM | POA: Diagnosis not present

## 2021-05-06 DIAGNOSIS — E669 Obesity, unspecified: Secondary | ICD-10-CM | POA: Diagnosis not present

## 2021-05-23 ENCOUNTER — Ambulatory Visit (INDEPENDENT_AMBULATORY_CARE_PROVIDER_SITE_OTHER): Payer: Medicare Other | Admitting: Podiatry

## 2021-05-23 ENCOUNTER — Other Ambulatory Visit: Payer: Self-pay

## 2021-05-23 DIAGNOSIS — L84 Corns and callosities: Secondary | ICD-10-CM

## 2021-05-23 DIAGNOSIS — M79675 Pain in left toe(s): Secondary | ICD-10-CM | POA: Diagnosis not present

## 2021-05-23 DIAGNOSIS — B351 Tinea unguium: Secondary | ICD-10-CM | POA: Diagnosis not present

## 2021-05-23 DIAGNOSIS — M79674 Pain in right toe(s): Secondary | ICD-10-CM | POA: Diagnosis not present

## 2021-05-23 DIAGNOSIS — E11610 Type 2 diabetes mellitus with diabetic neuropathic arthropathy: Secondary | ICD-10-CM

## 2021-05-23 DIAGNOSIS — M14671 Charcot's joint, right ankle and foot: Secondary | ICD-10-CM

## 2021-05-23 DIAGNOSIS — B353 Tinea pedis: Secondary | ICD-10-CM | POA: Diagnosis not present

## 2021-05-23 MED ORDER — KETOCONAZOLE 2 % EX CREA: 1.0000 "application " | TOPICAL_CREAM | Freq: Every day | CUTANEOUS | 2 refills | Status: AC

## 2021-05-23 MED ORDER — KETOCONAZOLE 2 % EX CREA: 1.0000 "application " | TOPICAL_CREAM | Freq: Every day | CUTANEOUS | 2 refills | Status: DC

## 2021-05-24 ENCOUNTER — Encounter: Payer: Self-pay | Admitting: Podiatry

## 2021-05-24 ENCOUNTER — Ambulatory Visit: Payer: Medicare Other | Admitting: Podiatry

## 2021-05-24 NOTE — Progress Notes (Signed)
  Subjective:  Patient ID: Aaron [name], male    DOB: 1950-07-26,  MRN: [id number]     70 y.o. male returns for follow-up for a wound check from grafting of the interspace of the right foot from a diabetic foot infection.  Reports no new ulceration.  He has elongated thickened toenails that are difficult for him to cut.  Calluses on the right great toe and at the prior wound site.  Has new scaling itchy skin  Objective:  Physical Exam: warm, good capillary refill and normal DP and PT pulses.   first interspace wound remains completely healed.  Onychomycosis with thickened elongated nail plates x10 with yellow toenails and subungual debris.  Hyperkeratosis in the first interspace and the medial hallux on the right foot.  2 separate lesions.  Has tinea pedis on both feet with dry itchy scaling skin  Assessment:   1. Tinea pedis of both feet   2. Pain due to onychomycosis of toenails of both feet   3. Charcot's joint of foot, right   4. Type 2 diabetes mellitus with diabetic neuropathic arthropathy, without long-term current use of insulin (HCC)   5. Callus of foot       Plan:  Patient was evaluated and treated and all questions answered.   Patient educated on diabetes. Discussed proper diabetic foot care and discussed risks and complications of disease. Educated patient in depth on reasons to return to the office immediately should he/she discover anything concerning or new on the feet. All questions answered. Discussed proper shoes as well.   All symptomatic hyperkeratoses were safely debrided with a sterile #15 blade to patient's level of comfort without incident. We discussed preventative and palliative care of these lesions including supportive and accommodative shoegear, padding, prefabricated and custom molded accommodative orthoses, use of a pumice stone and lotions/creams daily.  Discussed the etiology and treatment options for the condition in detail with the patient.  Educated patient on the topical and oral treatment options for mycotic nails. Recommended debridement of the nails today. Sharp and mechanical debridement performed of all painful and mycotic nails today. Nails debrided in length and thickness using a nail nipper to level of comfort. Discussed treatment options including appropriate shoe gear. Follow up as needed for painful nails.  Discussed the etiology and treatment options for tinea pedis.  Discussed topical and oral treatment.  Recommended topical treatment with 2% ketoconazole cream.  This was sent to the patient's pharmacy.  Also discussed appropriate foot hygiene, use of antifungal spray such as Tinactin in shoes, as well as cleaning her foot surfaces such as showers and bathroom floors with bleach.    No follow-ups on file.

## 2021-06-17 DIAGNOSIS — I10 Essential (primary) hypertension: Secondary | ICD-10-CM | POA: Diagnosis not present

## 2021-06-17 DIAGNOSIS — E7800 Pure hypercholesterolemia, unspecified: Secondary | ICD-10-CM | POA: Diagnosis not present

## 2021-06-17 DIAGNOSIS — E1140 Type 2 diabetes mellitus with diabetic neuropathy, unspecified: Secondary | ICD-10-CM | POA: Diagnosis not present

## 2021-06-17 DIAGNOSIS — E1165 Type 2 diabetes mellitus with hyperglycemia: Secondary | ICD-10-CM | POA: Diagnosis not present

## 2021-08-25 ENCOUNTER — Other Ambulatory Visit: Payer: Self-pay

## 2021-08-25 ENCOUNTER — Ambulatory Visit (INDEPENDENT_AMBULATORY_CARE_PROVIDER_SITE_OTHER): Payer: Medicare Other | Admitting: Podiatry

## 2021-08-25 DIAGNOSIS — E1140 Type 2 diabetes mellitus with diabetic neuropathy, unspecified: Secondary | ICD-10-CM | POA: Diagnosis not present

## 2021-08-25 DIAGNOSIS — L84 Corns and callosities: Secondary | ICD-10-CM | POA: Diagnosis not present

## 2021-08-25 DIAGNOSIS — B353 Tinea pedis: Secondary | ICD-10-CM

## 2021-08-25 DIAGNOSIS — M79674 Pain in right toe(s): Secondary | ICD-10-CM

## 2021-08-25 DIAGNOSIS — M79675 Pain in left toe(s): Secondary | ICD-10-CM

## 2021-08-25 DIAGNOSIS — E11610 Type 2 diabetes mellitus with diabetic neuropathic arthropathy: Secondary | ICD-10-CM

## 2021-08-25 DIAGNOSIS — B351 Tinea unguium: Secondary | ICD-10-CM | POA: Diagnosis not present

## 2021-08-25 MED ORDER — TERBINAFINE HCL 250 MG PO TABS: 250.0000 mg | ORAL_TABLET | Freq: Every day | ORAL | 0 refills | Status: AC

## 2021-08-30 NOTE — Progress Notes (Signed)
°  Subjective:  Patient ID: Aaron [name], male    DOB: [birth year],  MRN: [id number]     71 y.o. male returns for follow-up for a wound check from grafting of the interspace of the right foot from a diabetic foot infection.  Reports no new ulceration.  He has elongated thickened toenails that are difficult for him to cut.  Calluses on the right great toe and at the prior wound site.  Scaly itchy skin is improved he has been using the cream but has not gone away  Objective:  Physical Exam: warm, good capillary refill and normal DP and PT pulses.   first interspace wound remains completely healed.  Onychomycosis with thickened elongated nail plates x10 with yellow toenails and subungual debris.  Hyperkeratosis in the first interspace and the medial hallux on the right foot.  2 separate lesions.  Has tinea pedis on both feet with dry itchy scaling skin, slight improvement since last visit  Assessment:   1. Pain due to onychomycosis of toenails of both feet   2. Tinea pedis of both feet   3. Callus of foot   4. Neuropathy due to type 2 diabetes mellitus ([place])   5. Type 2 diabetes mellitus with diabetic neuropathic arthropathy, without long-term current use of insulin ([place])       Plan:  Patient was evaluated and treated and all questions answered.   Patient educated on diabetes. Discussed proper diabetic foot care and discussed risks and complications of disease. Educated patient in depth on reasons to return to the office immediately should he/she discover anything concerning or new on the feet. All questions answered. Discussed proper shoes as well.   All symptomatic hyperkeratoses were safely debrided with a sterile #15 blade to patient's level of comfort without incident. We discussed preventative and palliative care of these lesions including supportive and accommodative shoegear, padding, prefabricated and custom molded accommodative orthoses, use of a pumice stone and lotions/creams  daily.  Discussed the etiology and treatment options for the condition in detail with the patient. Educated patient on the topical and oral treatment options for mycotic nails. Recommended debridement of the nails today. Sharp and mechanical debridement performed of all painful and mycotic nails today. Nails debrided in length and thickness using a nail nipper to level of comfort. Discussed treatment options including appropriate shoe gear. Follow up as needed for painful nails.  Discussed the etiology and treatment options for tinea pedis.  continues using ketoconazole he had some improvement.  Has not fully resolved.  I recommended Lamisil for 4 weeks as well, advised if does not improve within the next month then would add Lotrisone cream.      Return in about 3 months (around 11/22/2021) for at risk diabetic foot care.

## 2021-11-22 ENCOUNTER — Ambulatory Visit (INDEPENDENT_AMBULATORY_CARE_PROVIDER_SITE_OTHER): Payer: Medicare Other | Admitting: Podiatry

## 2021-11-22 DIAGNOSIS — E11610 Type 2 diabetes mellitus with diabetic neuropathic arthropathy: Secondary | ICD-10-CM | POA: Diagnosis not present

## 2021-11-22 DIAGNOSIS — M79674 Pain in right toe(s): Secondary | ICD-10-CM | POA: Diagnosis not present

## 2021-11-22 DIAGNOSIS — M79675 Pain in left toe(s): Secondary | ICD-10-CM

## 2021-11-22 DIAGNOSIS — L84 Corns and callosities: Secondary | ICD-10-CM

## 2021-11-22 DIAGNOSIS — B351 Tinea unguium: Secondary | ICD-10-CM | POA: Diagnosis not present

## 2021-11-27 ENCOUNTER — Encounter: Payer: Self-pay | Admitting: Podiatry

## 2021-11-27 NOTE — Progress Notes (Signed)
?  Subjective:  ?Patient ID: Aaron [name], male    DOB: 01/22/1951,  MRN: [id number] ? ? ? ? ?71 y.o. male returns for follow-up.  Reports no new ulceration.  He has elongated thickened toenails that are difficult for him to cut.  Calluses on the right great toe and at the prior wound site.  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 ?Objective:  ?Physical Exam: ?warm, good capillary refill and normal DP and PT pulses.   first interspace wound remains completely healed.  Onychomycosis with thickened elongated nail plates x10 with yellow toenails and subungual debris.  Hyperkeratosis in the first interspace and the medial hallux on the right foot.  2 separate lesions.   ?Assessment:  ? ?1. Pain due to onychomycosis of toenails of both feet   ?2. Callus of foot   ?3. Type 2 diabetes mellitus with diabetic neuropathic arthropathy, without long-term current use of insulin ([place])   ? ? ? ? ?Plan:  ?Patient was evaluated and treated and all questions answered. ? ? ?Patient educated on diabetes. Discussed proper diabetic foot care and discussed risks and complications of disease. Educated patient in depth on reasons to return to the office immediately should he/she discover anything concerning or new on the feet. All questions answered. Discussed proper shoes as well.  ? ?All symptomatic hyperkeratoses were safely debrided with a sterile #15 blade to patient's level of comfort without incident. We discussed preventative and palliative care of these lesions including supportive and accommodative shoegear, padding, prefabricated and custom molded accommodative orthoses, use of a pumice stone and lotions/creams daily. ? ?Discussed the etiology and treatment options for the condition in detail with the patient. Educated patient on the topical and oral treatment options for mycotic nails. Recommended debridement of the nails today. Sharp and mechanical debridement performed of all painful and mycotic nails today. Nails debrided in length  and thickness using a nail nipper to level of comfort. Discussed treatment options including appropriate shoe gear. Follow up as needed for painful nails. ? ?Tinea pedis has resolved use ketoconazole again as needed ? ?Return in about 4 months (around 03/25/2022) for at risk diabetic foot care.  ? ? ?

## 2021-12-06 ENCOUNTER — Encounter (INDEPENDENT_AMBULATORY_CARE_PROVIDER_SITE_OTHER): Payer: Medicare Other | Admitting: Ophthalmology

## 2021-12-13 ENCOUNTER — Encounter (INDEPENDENT_AMBULATORY_CARE_PROVIDER_SITE_OTHER): Payer: Medicare Other | Admitting: Ophthalmology

## 2021-12-13 DIAGNOSIS — H43813 Vitreous degeneration, bilateral: Secondary | ICD-10-CM | POA: Diagnosis not present

## 2021-12-13 DIAGNOSIS — D3132 Benign neoplasm of left choroid: Secondary | ICD-10-CM

## 2021-12-13 DIAGNOSIS — E113393 Type 2 diabetes mellitus with moderate nonproliferative diabetic retinopathy without macular edema, bilateral: Secondary | ICD-10-CM | POA: Diagnosis not present

## 2021-12-13 DIAGNOSIS — H35033 Hypertensive retinopathy, bilateral: Secondary | ICD-10-CM | POA: Diagnosis not present

## 2021-12-13 DIAGNOSIS — I10 Essential (primary) hypertension: Secondary | ICD-10-CM | POA: Diagnosis not present

## 2022-01-10 DIAGNOSIS — E1165 Type 2 diabetes mellitus with hyperglycemia: Secondary | ICD-10-CM | POA: Diagnosis not present

## 2022-01-10 DIAGNOSIS — E11319 Type 2 diabetes mellitus with unspecified diabetic retinopathy without macular edema: Secondary | ICD-10-CM | POA: Diagnosis not present

## 2022-01-10 DIAGNOSIS — E7800 Pure hypercholesterolemia, unspecified: Secondary | ICD-10-CM | POA: Diagnosis not present

## 2022-01-10 DIAGNOSIS — I10 Essential (primary) hypertension: Secondary | ICD-10-CM | POA: Diagnosis not present

## 2022-01-10 DIAGNOSIS — E1140 Type 2 diabetes mellitus with diabetic neuropathy, unspecified: Secondary | ICD-10-CM | POA: Diagnosis not present

## 2022-01-27 IMAGING — MR MR FOOT*R* WO/W CM
7 of 9 series · 33 of 40 positions shown · IV contrast (multihance)
Comparison: Radiographs 01/26/2020

CLINICAL DATA: Diabetic foot ulcer with nonhealing blister along
the plantar aspect of the 1st MTP joint for 3 weeks.

EXAM:
MRI OF THE RIGHT FOREFOOT WITHOUT AND WITH CONTRAST
TECHNIQUE: Multiplanar, multisequence MR imaging of the right forefoot was
performed before and after the administration of intravenous
contrast.
CONTRAST:  20mL MULTIHANCE GADOBENATE DIMEGLUMINE 529 MG/ML IV SOLN

[Series 5: T1 · coronal · 4.0mm · 0.44mm/px · 5 of 42 slices shown (1 of 2)]
[im 1/42]
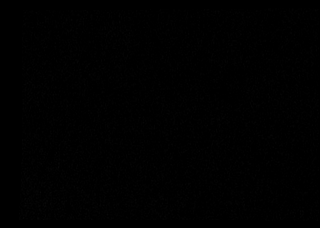
[im 11/42]
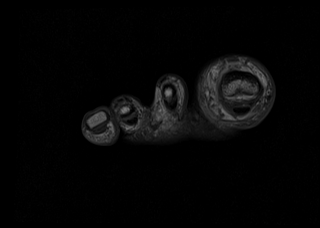
[im 21/42]
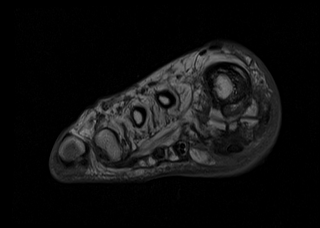
[im 31/42]
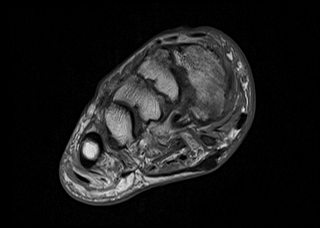
[im 42/42]
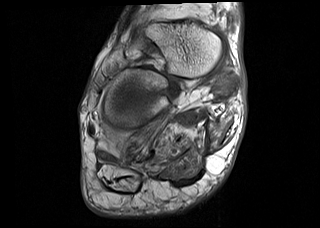

[Series 6: T2 fat-sat · coronal · 4.0mm · 0.27mm/px · 5 of 40 slices shown (1 of 2)]
[im 1/40]
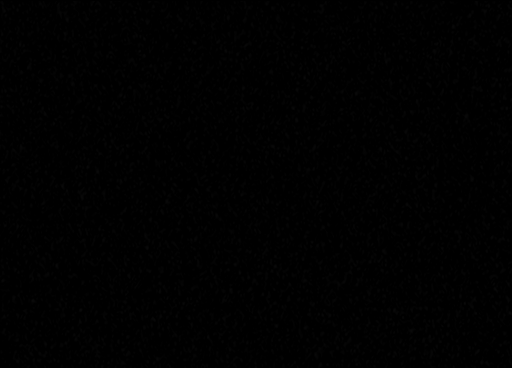
[im 10/40]
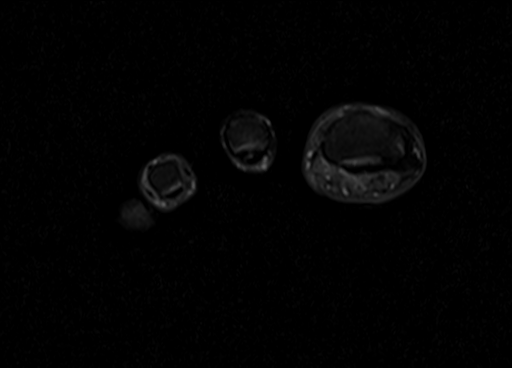
[im 20/40]
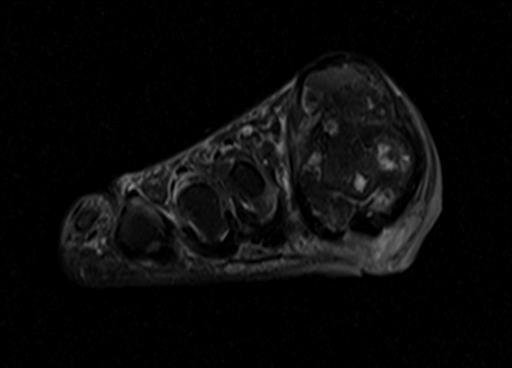
[im 30/40]
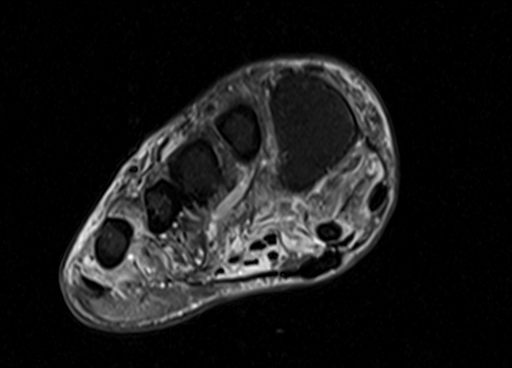
[im 40/40]
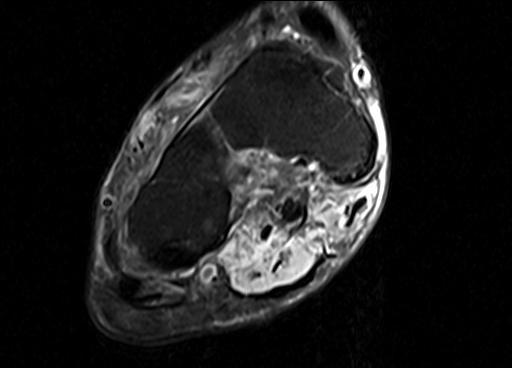

[Series 8: T1 · axial · 3.0mm · 0.31mm/px · z∈[-70,+5]mm · 4 of 26 slices shown (2 of 2)]
[im 1/26]
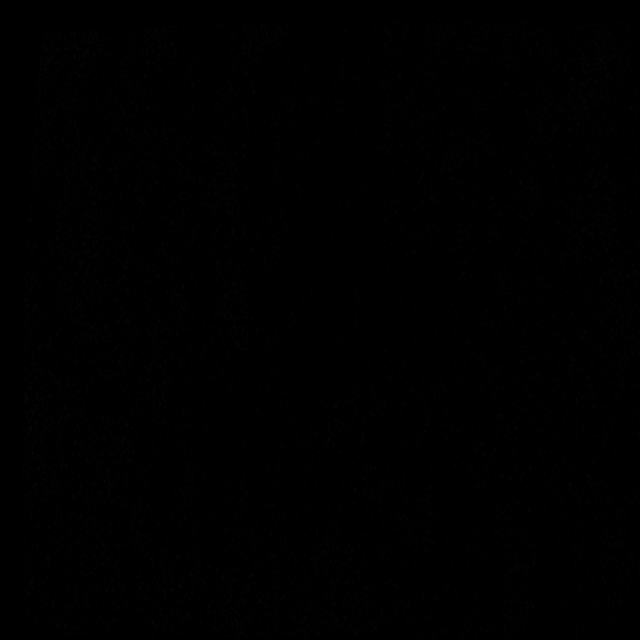
[im 9/26]
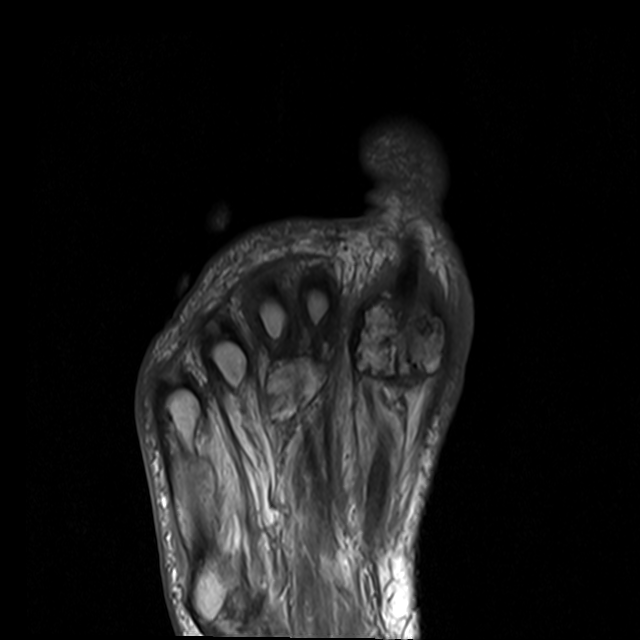
[im 17/26]
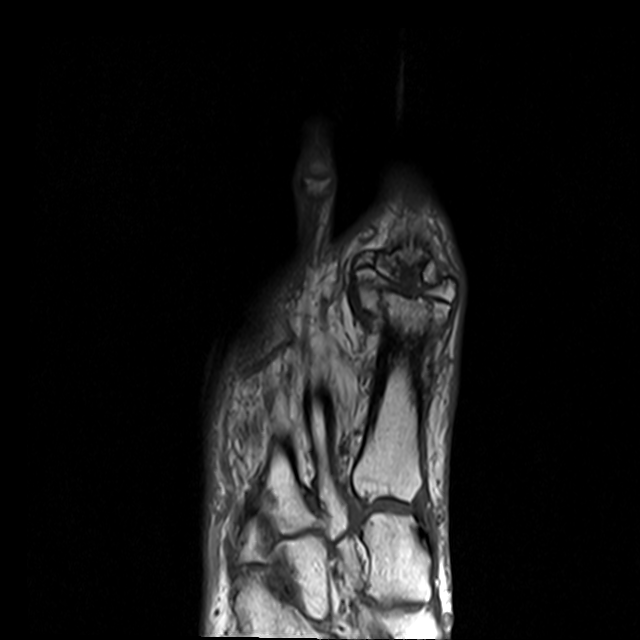
[im 26/26]
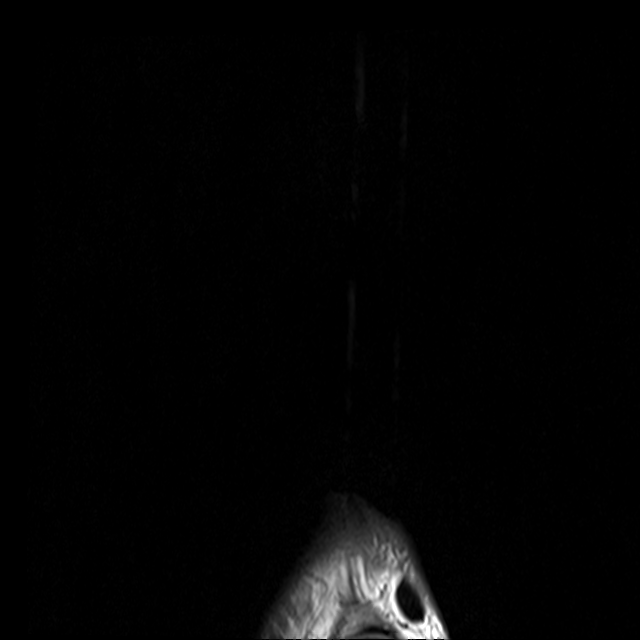

[Series 9: PD fat-sat · sagittal · 4.0mm · 0.52mm/px · 3 of 25 slices shown]
[im 1/25]
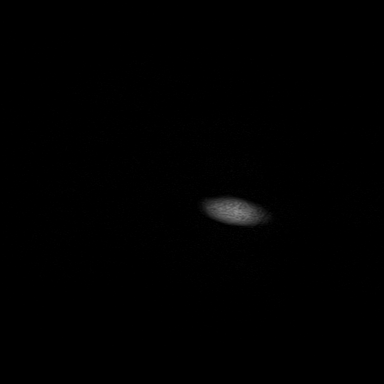
[im 13/25]
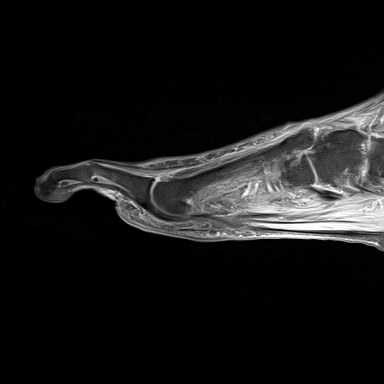
[im 25/25]
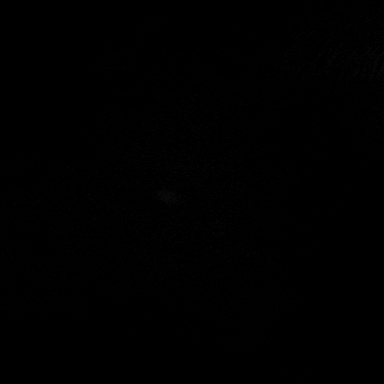

[Series 10: T2 fat-sat · axial · 3.0mm · 0.78mm/px · z∈[-70,+5]mm · 4 of 26 slices shown (2 of 2)]
[im 1/26]
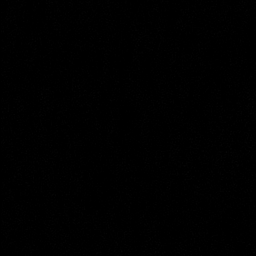
[im 9/26]
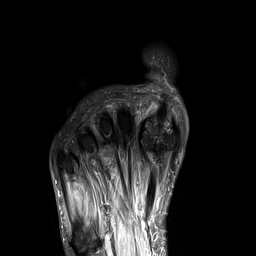
[im 17/26]
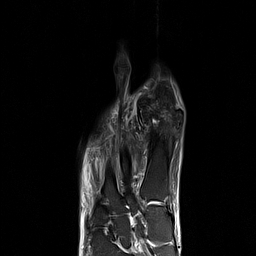
[im 26/26]
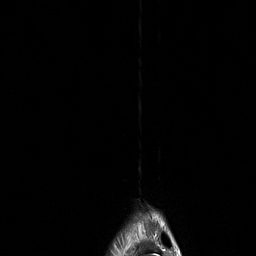

[Series 11: T1 fat-sat · coronal · 4.0mm · 0.44mm/px · 6 of 40 slices shown]
[im 1/40]
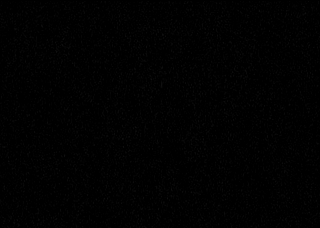
[im 8/40]
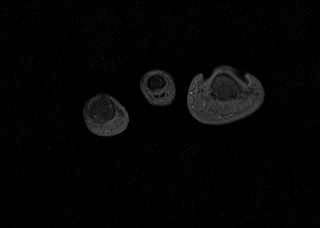
[im 16/40]
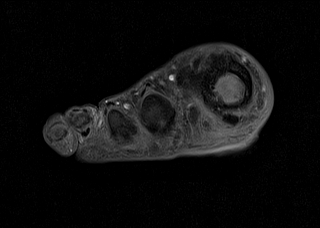
[im 24/40]
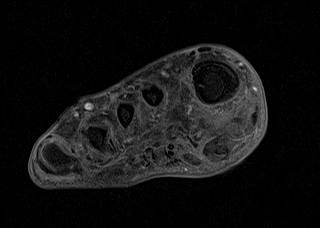
[im 32/40]
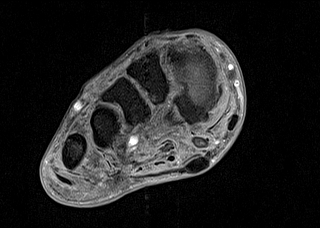
[im 40/40]
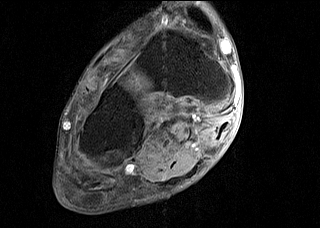

[Series 12: T1 fat-sat post-contrast · coronal · 4.0mm · 0.44mm/px · 6 of 40 slices shown]
[im 1/40]
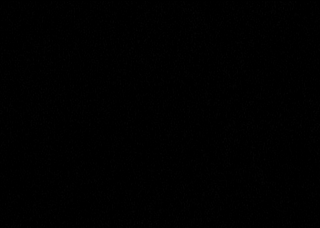
[im 8/40]
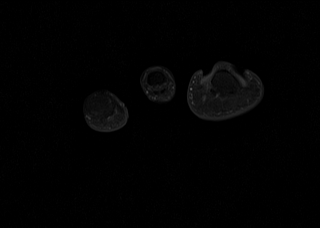
[im 16/40]
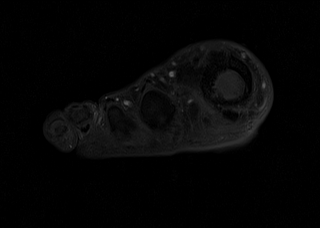
[im 24/40]
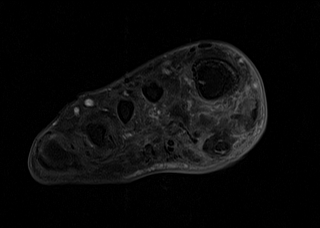
[im 32/40]
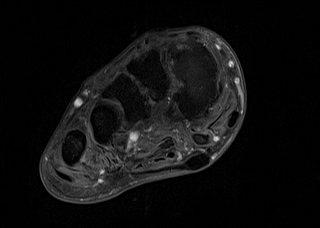
[im 40/40]
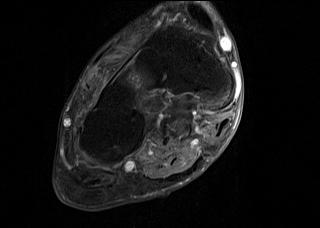

[33 of 40 positions shown; findings below may reference images not displayed]

FINDINGS: Bones/Joint/Cartilage

There is advanced arthropathy at the 1st metatarsophalangeal joint
with advanced joint space narrowing, osteophytes and subchondral
cyst formation, especially in the base of the proximal phalanx.
There is relatively mild involvement of the sesamoids. No bone
destruction, marrow edema or suspicious enhancement. No significant
joint effusion.

The additional metatarsophalangeal and interphalangeal joints appear
normal. There are mild degenerative changes at the Lisfranc joint.

Ligaments

Intact Lisfranc ligament.

Muscles and Tendons

The forefoot tendons appear intact without significant
tenosynovitis. There is mild nodular thickening of the central cord
of the plantar fascia distally which may reflect mild plantar
fibromatosis or inflammation. Mild diffuse forefoot muscular T2
hyperintensity attributed to diabetic myopathy. No suspicious
muscular enhancement.

Soft tissues

There is soft tissue ulceration along the plantar aspect of the
medial forefoot at the level of the 1st MTP joint. There are
underlying inflammatory changes and heterogeneous enhancement in the
underlying subcutaneous fat, but no focal fluid collection or
foreign body.
IMPRESSION: 1. Soft tissue ulceration along the plantar aspect of the medial
forefoot at the level of the 1st MTP joint with underlying
inflammatory changes and heterogeneous enhancement in the underlying
subcutaneous fat. No evidence of abscess or foreign body.
2. No evidence of osteomyelitis.
3. Severe osteoarthritis at the 1st MTP joint.
4. Possible mild plantar fibromatosis.

## 2022-03-28 ENCOUNTER — Ambulatory Visit (INDEPENDENT_AMBULATORY_CARE_PROVIDER_SITE_OTHER): Payer: Medicare Other | Admitting: Podiatry

## 2022-03-28 DIAGNOSIS — M79674 Pain in right toe(s): Secondary | ICD-10-CM

## 2022-03-28 DIAGNOSIS — M79675 Pain in left toe(s): Secondary | ICD-10-CM | POA: Diagnosis not present

## 2022-03-28 DIAGNOSIS — L84 Corns and callosities: Secondary | ICD-10-CM | POA: Diagnosis not present

## 2022-03-28 DIAGNOSIS — E11610 Type 2 diabetes mellitus with diabetic neuropathic arthropathy: Secondary | ICD-10-CM | POA: Diagnosis not present

## 2022-03-28 DIAGNOSIS — B351 Tinea unguium: Secondary | ICD-10-CM | POA: Diagnosis not present

## 2022-03-30 NOTE — Progress Notes (Signed)
  Subjective:  Patient ID: Aaron [name], male    DOB: 11-26-1950,  MRN: [id number]     71 y.o. male returns for follow-up.  Reports no new ulceration.  He has elongated thickened toenails that are difficult for him to cut.  Calluses on the right great toe and at the prior wound site.   Objective:  Physical Exam: warm, good capillary refill and normal DP and PT pulses.   first interspace wound remains completely healed.  Onychomycosis with thickened elongated nail plates x10 with yellow toenails and subungual debris.  Hyperkeratosis in the first interspace and the medial hallux on the right foot.  2 separate lesions.   Assessment:   1. Pain due to onychomycosis of toenails of both feet   2. Type 2 diabetes mellitus with diabetic neuropathic arthropathy, without long-term current use of insulin (HCC)   3. Callus of foot       Plan:  Patient was evaluated and treated and all questions answered.   Patient educated on diabetes. Discussed proper diabetic foot care and discussed risks and complications of disease. Educated patient in depth on reasons to return to the office immediately should he/she discover anything concerning or new on the feet. All questions answered. Discussed proper shoes as well.   All symptomatic hyperkeratoses were safely debrided with a sterile #15 blade to patient's level of comfort without incident. We discussed preventative and palliative care of these lesions including supportive and accommodative shoegear, padding, prefabricated and custom molded accommodative orthoses, use of a pumice stone and lotions/creams daily.  Discussed the etiology and treatment options for the condition in detail with the patient. Educated patient on the topical and oral treatment options for mycotic nails. Recommended debridement of the nails today. Sharp and mechanical debridement performed of all painful and mycotic nails today. Nails debrided in length and thickness using a nail  nipper to level of comfort. Discussed treatment options including appropriate shoe gear. Follow up as needed for painful nails.    Return in about 3 months (around 06/27/2022) for at risk diabetic foot care.

## 2022-05-15 DIAGNOSIS — I10 Essential (primary) hypertension: Secondary | ICD-10-CM | POA: Diagnosis not present

## 2022-05-15 DIAGNOSIS — E1140 Type 2 diabetes mellitus with diabetic neuropathy, unspecified: Secondary | ICD-10-CM | POA: Diagnosis not present

## 2022-05-15 DIAGNOSIS — E7800 Pure hypercholesterolemia, unspecified: Secondary | ICD-10-CM | POA: Diagnosis not present

## 2022-05-15 DIAGNOSIS — E1165 Type 2 diabetes mellitus with hyperglycemia: Secondary | ICD-10-CM | POA: Diagnosis not present

## 2022-05-31 DIAGNOSIS — Z23 Encounter for immunization: Secondary | ICD-10-CM | POA: Diagnosis not present

## 2022-06-20 ENCOUNTER — Encounter (INDEPENDENT_AMBULATORY_CARE_PROVIDER_SITE_OTHER): Payer: Medicare Other | Admitting: Ophthalmology

## 2022-06-27 ENCOUNTER — Encounter (INDEPENDENT_AMBULATORY_CARE_PROVIDER_SITE_OTHER): Payer: Medicare Other | Admitting: Ophthalmology

## 2022-06-27 DIAGNOSIS — I10 Essential (primary) hypertension: Secondary | ICD-10-CM

## 2022-06-27 DIAGNOSIS — E113393 Type 2 diabetes mellitus with moderate nonproliferative diabetic retinopathy without macular edema, bilateral: Secondary | ICD-10-CM

## 2022-06-27 DIAGNOSIS — D3132 Benign neoplasm of left choroid: Secondary | ICD-10-CM | POA: Diagnosis not present

## 2022-06-27 DIAGNOSIS — H35033 Hypertensive retinopathy, bilateral: Secondary | ICD-10-CM | POA: Diagnosis not present

## 2022-06-27 DIAGNOSIS — H43813 Vitreous degeneration, bilateral: Secondary | ICD-10-CM | POA: Diagnosis not present

## 2022-07-25 ENCOUNTER — Ambulatory Visit: Payer: Medicare Other | Admitting: Podiatry

## 2022-08-01 ENCOUNTER — Ambulatory Visit: Payer: PPO | Admitting: Podiatry

## 2022-08-01 DIAGNOSIS — M79675 Pain in left toe(s): Secondary | ICD-10-CM

## 2022-08-01 DIAGNOSIS — E11610 Type 2 diabetes mellitus with diabetic neuropathic arthropathy: Secondary | ICD-10-CM | POA: Diagnosis not present

## 2022-08-01 DIAGNOSIS — B351 Tinea unguium: Secondary | ICD-10-CM

## 2022-08-01 DIAGNOSIS — E1140 Type 2 diabetes mellitus with diabetic neuropathy, unspecified: Secondary | ICD-10-CM

## 2022-08-01 DIAGNOSIS — M79674 Pain in right toe(s): Secondary | ICD-10-CM

## 2022-08-01 DIAGNOSIS — L84 Corns and callosities: Secondary | ICD-10-CM | POA: Diagnosis not present

## 2022-08-01 DIAGNOSIS — L97411 Non-pressure chronic ulcer of right heel and midfoot limited to breakdown of skin: Secondary | ICD-10-CM

## 2022-08-02 MED ORDER — MUPIROCIN 2 % EX OINT: 1.0000 | TOPICAL_OINTMENT | Freq: Two times a day (BID) | CUTANEOUS | 2 refills | Status: AC

## 2022-08-02 NOTE — Progress Notes (Signed)
  Subjective:  Patient ID: Aaron [name], male    DOB: [birth year],  MRN: [id number]     72 y.o. male returns for follow-up.  Reports no new ulceration.  He has elongated thickened toenails that are difficult for him to cut.  Calluses have thickened as well.  He has a new issue of a blister and sore on the right heel  Objective:  Physical Exam: warm, good capillary refill and normal DP and PT pulses.   first interspace wound remains completely healed.  Onychomycosis with thickened elongated nail plates x10 with yellow toenails and subungual debris.  Hyperkeratosis in the first interspace and the medial hallux on the right foot and submetatarsal 5 left.  New ulceration plantar lateral heel on right with breakdown limited to skin, serous drainage, no signs of infection or ulceration Assessment:   1. Pain due to onychomycosis of toenails of both feet   2. Type 2 diabetes mellitus with diabetic neuropathic arthropathy, without long-term current use of insulin (HCC)   3. Callus of foot       Plan:  Patient was evaluated and treated and all questions answered.  He has a new ulceration on the plantar lateral heel.  Recommend leaving open to air at night while enclosed shoes would bandage with mupirocin ointment which I sent to his pharmacy.  I will see him back in 4 weeks for reevaluation in 3 months for his regular diabetic footcare  All symptomatic hyperkeratoses were safely debrided with a sterile #15 blade to patient's level of comfort without incident. We discussed preventative and palliative care of these lesions including supportive and accommodative shoegear, padding, prefabricated and custom molded accommodative orthoses, use of a pumice stone and lotions/creams daily.  Discussed the etiology and treatment options for the condition in detail with the patient. Educated patient on the topical and oral treatment options for mycotic nails. Recommended debridement of the nails today. Sharp  and mechanical debridement performed of all painful and mycotic nails today. Nails debrided in length and thickness using a nail nipper to level of comfort. Discussed treatment options including appropriate shoe gear. Follow up as needed for painful nails.    Return in about 3 months (around 06/27/2022) for at risk diabetic foot care.

## 2022-08-15 DIAGNOSIS — E7800 Pure hypercholesterolemia, unspecified: Secondary | ICD-10-CM | POA: Diagnosis not present

## 2022-08-15 DIAGNOSIS — E1165 Type 2 diabetes mellitus with hyperglycemia: Secondary | ICD-10-CM | POA: Diagnosis not present

## 2022-08-22 DIAGNOSIS — I10 Essential (primary) hypertension: Secondary | ICD-10-CM | POA: Diagnosis not present

## 2022-08-22 DIAGNOSIS — E11319 Type 2 diabetes mellitus with unspecified diabetic retinopathy without macular edema: Secondary | ICD-10-CM | POA: Diagnosis not present

## 2022-08-22 DIAGNOSIS — E1165 Type 2 diabetes mellitus with hyperglycemia: Secondary | ICD-10-CM | POA: Diagnosis not present

## 2022-08-22 DIAGNOSIS — E7800 Pure hypercholesterolemia, unspecified: Secondary | ICD-10-CM | POA: Diagnosis not present

## 2022-08-29 ENCOUNTER — Ambulatory Visit: Payer: PPO | Admitting: Podiatry

## 2022-08-29 VITALS — BP 124/62 | HR 84

## 2022-08-29 DIAGNOSIS — L84 Corns and callosities: Secondary | ICD-10-CM

## 2022-08-31 NOTE — Progress Notes (Signed)
  Subjective:  Patient ID: Aaron [name], male    DOB: [birth year],  MRN: [id number]     72 y.o. male returns for follow-up.  Blisters are doing much better there is no drainage now.  Objective:  Physical Exam: warm, good capillary refill and normal DP and PT pulses.  Foot submetatarsal 5 and plantar lateral right heel blisters have healed with no drainage active Assessment:   1. Pre-ulcerative calluses       Plan:  Patient was evaluated and treated and all questions answered.  Doing much better.  Continue to use moisturizing lotion on these areas.  Watch closely for recurrence.  I will see him as scheduled.    No follow-ups on file.

## 2022-10-31 ENCOUNTER — Ambulatory Visit: Payer: PPO | Admitting: Podiatry

## 2022-10-31 DIAGNOSIS — L84 Corns and callosities: Secondary | ICD-10-CM

## 2022-10-31 DIAGNOSIS — M79674 Pain in right toe(s): Secondary | ICD-10-CM | POA: Diagnosis not present

## 2022-10-31 DIAGNOSIS — B351 Tinea unguium: Secondary | ICD-10-CM

## 2022-10-31 DIAGNOSIS — M79675 Pain in left toe(s): Secondary | ICD-10-CM | POA: Diagnosis not present

## 2022-10-31 DIAGNOSIS — E11610 Type 2 diabetes mellitus with diabetic neuropathic arthropathy: Secondary | ICD-10-CM

## 2022-10-31 NOTE — Progress Notes (Signed)
    Subjective:  Patient ID: Aaron [name], male    DOB: [birth year],  MRN: [id number]     72 y.o. male returns for follow-up.  Reports no new ulceration.  He has elongated thickened toenails that are difficult for him to cut.  Calluses on the right great toe and at the prior wound site and onto the outside of the left foot.   Objective:  Physical Exam: warm, good capillary refill and normal DP and PT pulses.   first interspace wound remains completely healed.  Onychomycosis with thickened elongated nail plates W09 with yellow toenails and subungual debris.  Hyperkeratosis in the first interspace and the medial hallux on the right foot, submetatarsal 5 on the left foot.  3 separate lesions.   Assessment:   1. Pain due to onychomycosis of toenails of both feet   2. Pre-ulcerative calluses   3. Type 2 diabetes mellitus with diabetic neuropathic arthropathy, without long-term current use of insulin       Plan:  Patient was evaluated and treated and all questions answered.   Patient educated on diabetes. Discussed proper diabetic foot care and discussed risks and complications of disease. Educated patient in depth on reasons to return to the office immediately should he/she discover anything concerning or new on the feet. All questions answered. Discussed proper shoes as well.   All symptomatic hyperkeratoses were safely debrided with a sterile #15 blade to patient's level of comfort without incident. We discussed preventative and palliative care of these lesions including supportive and accommodative shoegear, padding, prefabricated and custom molded accommodative orthoses, use of a pumice stone and lotions/creams daily.  Discussed the etiology and treatment options for the condition in detail with the patient. Educated patient on the topical and oral treatment options for mycotic nails. Recommended debridement of the nails today. Sharp and mechanical debridement performed of all painful  and mycotic nails today. Nails debrided in length and thickness using a nail nipper to level of comfort. Discussed treatment options including appropriate shoe gear. Follow up as needed for painful nails.    Return in about 4 months (around 03/02/2023) for at risk diabetic foot care.

## 2022-11-20 DIAGNOSIS — E7800 Pure hypercholesterolemia, unspecified: Secondary | ICD-10-CM | POA: Diagnosis not present

## 2022-11-20 DIAGNOSIS — E1165 Type 2 diabetes mellitus with hyperglycemia: Secondary | ICD-10-CM | POA: Diagnosis not present

## 2022-11-20 DIAGNOSIS — E1140 Type 2 diabetes mellitus with diabetic neuropathy, unspecified: Secondary | ICD-10-CM | POA: Diagnosis not present

## 2022-11-20 DIAGNOSIS — I10 Essential (primary) hypertension: Secondary | ICD-10-CM | POA: Diagnosis not present

## 2022-12-25 DIAGNOSIS — R300 Dysuria: Secondary | ICD-10-CM | POA: Diagnosis not present

## 2022-12-26 ENCOUNTER — Encounter (INDEPENDENT_AMBULATORY_CARE_PROVIDER_SITE_OTHER): Payer: PPO | Admitting: Ophthalmology

## 2022-12-26 DIAGNOSIS — Z7984 Long term (current) use of oral hypoglycemic drugs: Secondary | ICD-10-CM

## 2022-12-26 DIAGNOSIS — H43813 Vitreous degeneration, bilateral: Secondary | ICD-10-CM | POA: Diagnosis not present

## 2022-12-26 DIAGNOSIS — D3132 Benign neoplasm of left choroid: Secondary | ICD-10-CM | POA: Diagnosis not present

## 2022-12-26 DIAGNOSIS — H35033 Hypertensive retinopathy, bilateral: Secondary | ICD-10-CM

## 2022-12-26 DIAGNOSIS — I10 Essential (primary) hypertension: Secondary | ICD-10-CM | POA: Diagnosis not present

## 2022-12-26 DIAGNOSIS — Z794 Long term (current) use of insulin: Secondary | ICD-10-CM

## 2022-12-26 DIAGNOSIS — E113393 Type 2 diabetes mellitus with moderate nonproliferative diabetic retinopathy without macular edema, bilateral: Secondary | ICD-10-CM

## 2022-12-27 DIAGNOSIS — E1165 Type 2 diabetes mellitus with hyperglycemia: Secondary | ICD-10-CM | POA: Diagnosis not present

## 2022-12-27 DIAGNOSIS — E1140 Type 2 diabetes mellitus with diabetic neuropathy, unspecified: Secondary | ICD-10-CM | POA: Diagnosis not present

## 2022-12-27 DIAGNOSIS — I10 Essential (primary) hypertension: Secondary | ICD-10-CM | POA: Diagnosis not present

## 2023-02-20 DIAGNOSIS — E1165 Type 2 diabetes mellitus with hyperglycemia: Secondary | ICD-10-CM | POA: Diagnosis not present

## 2023-03-06 ENCOUNTER — Ambulatory Visit: Payer: PPO | Admitting: Podiatry

## 2023-03-06 DIAGNOSIS — M79674 Pain in right toe(s): Secondary | ICD-10-CM | POA: Diagnosis not present

## 2023-03-06 DIAGNOSIS — L84 Corns and callosities: Secondary | ICD-10-CM | POA: Diagnosis not present

## 2023-03-06 DIAGNOSIS — B351 Tinea unguium: Secondary | ICD-10-CM

## 2023-03-06 DIAGNOSIS — M79675 Pain in left toe(s): Secondary | ICD-10-CM

## 2023-03-06 DIAGNOSIS — E11610 Type 2 diabetes mellitus with diabetic neuropathic arthropathy: Secondary | ICD-10-CM | POA: Diagnosis not present

## 2023-03-06 NOTE — Progress Notes (Signed)
    Subjective:  Patient ID: Aaron [name], male    DOB: 04-20-1951,  MRN: [id number]     72 y.o. male returns for follow-up.  Reports no new ulceration.  He has elongated thickened toenails that are difficult for him to cut.  Calluses on the right great toe and at the prior wound site and onto the outside of the left foot.   Objective:  Physical Exam: warm, good capillary refill and normal DP and PT pulses.   first interspace wound remains completely healed.  Onychomycosis with thickened elongated nail plates N82 with yellow toenails and subungual debris.  Hyperkeratosis in the first interspace and the medial hallux on the right foot, submetatarsal 5 on the left foot.  3 separate lesions.   Assessment:   1. Pain due to onychomycosis of toenails of both feet   2. Pre-ulcerative calluses   3. Type 2 diabetes mellitus with diabetic neuropathic arthropathy, without long-term current use of insulin (HCC)       Plan:  Patient was evaluated and treated and all questions answered.   Patient educated on diabetes. Discussed [name] diabetic foot care and discussed risks and complications of disease. Educated patient in depth on reasons to return to the office immediately should he/she discover anything concerning or new on the feet. All questions answered. Discussed [name] shoes as well.   All symptomatic hyperkeratoses were safely debrided with a sterile #15 blade to patient's level of comfort without incident. We discussed preventative and palliative care of these lesions including supportive and accommodative shoegear, padding, prefabricated and custom molded accommodative orthoses, use of a pumice stone and lotions/creams daily.  Recommend he use O'Keefe's foot cream with urea to alleviate the calluses.  Discussed the etiology and treatment options for the condition in detail with the patient. Educated patient on the topical and oral treatment options for mycotic nails. Recommended  debridement of the nails today. Sharp and mechanical debridement performed of all painful and mycotic nails today. Nails debrided in length and thickness using a nail nipper to level of comfort. Discussed treatment options including appropriate shoe gear. Follow up as needed for painful nails.    Return in about 4 months (around 07/06/2023) for at risk diabetic foot care.

## 2023-03-12 DIAGNOSIS — E7800 Pure hypercholesterolemia, unspecified: Secondary | ICD-10-CM | POA: Diagnosis not present

## 2023-03-12 DIAGNOSIS — E1140 Type 2 diabetes mellitus with diabetic neuropathy, unspecified: Secondary | ICD-10-CM | POA: Diagnosis not present

## 2023-03-12 DIAGNOSIS — E1165 Type 2 diabetes mellitus with hyperglycemia: Secondary | ICD-10-CM | POA: Diagnosis not present

## 2023-03-12 DIAGNOSIS — I10 Essential (primary) hypertension: Secondary | ICD-10-CM | POA: Diagnosis not present

## 2023-05-07 DIAGNOSIS — H25813 Combined forms of age-related cataract, bilateral: Secondary | ICD-10-CM | POA: Diagnosis not present

## 2023-05-07 DIAGNOSIS — D3132 Benign neoplasm of left choroid: Secondary | ICD-10-CM | POA: Diagnosis not present

## 2023-05-07 DIAGNOSIS — H524 Presbyopia: Secondary | ICD-10-CM | POA: Diagnosis not present

## 2023-05-07 DIAGNOSIS — E113293 Type 2 diabetes mellitus with mild nonproliferative diabetic retinopathy without macular edema, bilateral: Secondary | ICD-10-CM | POA: Diagnosis not present

## 2023-05-07 DIAGNOSIS — H43811 Vitreous degeneration, right eye: Secondary | ICD-10-CM | POA: Diagnosis not present

## 2023-06-26 ENCOUNTER — Encounter (INDEPENDENT_AMBULATORY_CARE_PROVIDER_SITE_OTHER): Payer: PPO | Admitting: Ophthalmology

## 2023-07-12 ENCOUNTER — Ambulatory Visit: Payer: PPO | Admitting: Podiatry

## 2023-07-17 DIAGNOSIS — E1165 Type 2 diabetes mellitus with hyperglycemia: Secondary | ICD-10-CM | POA: Diagnosis not present

## 2023-07-17 DIAGNOSIS — E7800 Pure hypercholesterolemia, unspecified: Secondary | ICD-10-CM | POA: Diagnosis not present

## 2023-07-19 ENCOUNTER — Encounter: Payer: Self-pay | Admitting: Podiatry

## 2023-07-19 ENCOUNTER — Ambulatory Visit: Payer: PPO | Admitting: Podiatry

## 2023-07-19 VITALS — Ht 74.0 in | Wt 196.0 lb

## 2023-07-19 DIAGNOSIS — B351 Tinea unguium: Secondary | ICD-10-CM | POA: Diagnosis not present

## 2023-07-19 DIAGNOSIS — L84 Corns and callosities: Secondary | ICD-10-CM

## 2023-07-19 DIAGNOSIS — M79675 Pain in left toe(s): Secondary | ICD-10-CM

## 2023-07-19 DIAGNOSIS — E11610 Type 2 diabetes mellitus with diabetic neuropathic arthropathy: Secondary | ICD-10-CM | POA: Diagnosis not present

## 2023-07-19 DIAGNOSIS — M79674 Pain in right toe(s): Secondary | ICD-10-CM

## 2023-07-19 NOTE — Progress Notes (Signed)
    Subjective:  Patient ID: Aaron [name], male    DOB: 07-Feb-1951,  MRN: [id number]     73 y.o. male returns for follow-up.  Reports no new ulceration.  He has elongated thickened toenails that are difficult for him to cut.  Calluses on the right great toe and at the prior wound site he has been using the urea  cream trying to do at least once daily but sometimes does not get to it  Objective:  Physical Exam: warm, good capillary refill and normal DP and PT pulses.   first interspace wound remains completely healed.  Onychomycosis with thickened elongated nail plates k89 with yellow toenails and subungual debris.  Hyperkeratosis in the first interspace and the medial hallux on the right foot.  All calluses thickened and much more dry skin than previous exam Assessment:   1. Pain due to onychomycosis of toenails of both feet   2. Pre-ulcerative calluses   3. Type 2 diabetes mellitus with diabetic neuropathic arthropathy, without long-term current use of insulin  [facility])       Plan:  Patient was evaluated and treated and all questions answered.  All symptomatic hyperkeratoses were safely debrided with a sterile #15 blade to patient's level of comfort without incident. We discussed preventative and palliative care of these lesions including supportive and accommodative shoegear, padding, prefabricated and custom molded accommodative orthoses, use of a pumice stone and lotions/creams daily.  Continue O'Keefe's foot cream with area which she has been doing but needs to be more consistent and use it twice daily over the winter due to the significant dryness present  Discussed the etiology and treatment options for the condition in detail with the patient.. Recommended debridement of the nails today. Sharp and mechanical debridement performed of all painful and mycotic nails today. Nails debrided in length and thickness using a nail nipper to level of comfort. Discussed treatment options  including appropriate shoe gear. Follow up as needed for painful nails.    Return in about 4 months (around 11/16/2023) for at risk diabetic foot care.

## 2023-07-24 DIAGNOSIS — E11319 Type 2 diabetes mellitus with unspecified diabetic retinopathy without macular edema: Secondary | ICD-10-CM | POA: Diagnosis not present

## 2023-07-24 DIAGNOSIS — E1140 Type 2 diabetes mellitus with diabetic neuropathy, unspecified: Secondary | ICD-10-CM | POA: Diagnosis not present

## 2023-07-24 DIAGNOSIS — E1165 Type 2 diabetes mellitus with hyperglycemia: Secondary | ICD-10-CM | POA: Diagnosis not present

## 2023-07-24 DIAGNOSIS — I10 Essential (primary) hypertension: Secondary | ICD-10-CM | POA: Diagnosis not present

## 2023-07-24 DIAGNOSIS — E7800 Pure hypercholesterolemia, unspecified: Secondary | ICD-10-CM | POA: Diagnosis not present

## 2023-08-02 ENCOUNTER — Encounter (INDEPENDENT_AMBULATORY_CARE_PROVIDER_SITE_OTHER): Payer: PPO | Admitting: Ophthalmology

## 2023-08-02 DIAGNOSIS — D3132 Benign neoplasm of left choroid: Secondary | ICD-10-CM

## 2023-08-02 DIAGNOSIS — I10 Essential (primary) hypertension: Secondary | ICD-10-CM

## 2023-08-02 DIAGNOSIS — E113393 Type 2 diabetes mellitus with moderate nonproliferative diabetic retinopathy without macular edema, bilateral: Secondary | ICD-10-CM | POA: Diagnosis not present

## 2023-08-02 DIAGNOSIS — H35033 Hypertensive retinopathy, bilateral: Secondary | ICD-10-CM

## 2023-08-02 DIAGNOSIS — H43813 Vitreous degeneration, bilateral: Secondary | ICD-10-CM | POA: Diagnosis not present

## 2023-08-02 DIAGNOSIS — Z7984 Long term (current) use of oral hypoglycemic drugs: Secondary | ICD-10-CM | POA: Diagnosis not present

## 2023-08-02 DIAGNOSIS — Z794 Long term (current) use of insulin: Secondary | ICD-10-CM | POA: Diagnosis not present

## 2023-11-19 ENCOUNTER — Encounter: Payer: Self-pay | Admitting: Podiatry

## 2023-11-19 ENCOUNTER — Ambulatory Visit: Payer: PPO | Admitting: Podiatry

## 2023-11-19 VITALS — Ht 74.0 in | Wt 196.0 lb

## 2023-11-19 DIAGNOSIS — M79674 Pain in right toe(s): Secondary | ICD-10-CM

## 2023-11-19 DIAGNOSIS — B351 Tinea unguium: Secondary | ICD-10-CM

## 2023-11-19 DIAGNOSIS — L84 Corns and callosities: Secondary | ICD-10-CM | POA: Diagnosis not present

## 2023-11-19 DIAGNOSIS — M79675 Pain in left toe(s): Secondary | ICD-10-CM

## 2023-11-19 DIAGNOSIS — E11610 Type 2 diabetes mellitus with diabetic neuropathic arthropathy: Secondary | ICD-10-CM | POA: Diagnosis not present

## 2023-11-20 NOTE — Progress Notes (Signed)
    Subjective:  Patient ID: Aaron [name], male    DOB: 1951/01/26,  MRN: [id number]     73 y.o. male returns for follow-up.  Reports no new ulceration.  He has elongated thickened toenails that are difficult for him to cut.  Calluses on the right great toe and at the prior wound site still present as well as the left foot.  Has a spot on the distal right great toe that he bumped and is healing well  Objective:  Physical Exam: warm, good capillary refill and normal DP and PT pulses.   first interspace wound remains completely healed.  Onychomycosis with thickened elongated nail plates A21 with yellow toenails and subungual debris.  Hyperkeratosis in the first interspace and the medial hallux on the right foot.  Submetatarsal 5 on the left foot.  All calluses thickened and much more dry skin than previous exam.  Healing scab at distal right hallux Assessment:   1. Pain due to onychomycosis of toenails of both feet   2. Pre-ulcerative calluses   3. Type 2 diabetes mellitus with diabetic neuropathic arthropathy, without long-term current use of insulin  [facility])       Plan:  Patient was evaluated and treated and all questions answered.  All symptomatic hyperkeratoses were safely debrided with a sterile #15 blade to patient's level of comfort without incident.  Continue using O'Keefe's foot cream and pumice stone   Discussed the etiology and treatment options for the condition in detail with the patient.. Recommended debridement of the nails today. Sharp and mechanical debridement performed of all painful and mycotic nails today. Nails debrided in length and thickness using a nail nipper to level of comfort. Discussed treatment options including appropriate shoe gear. Follow up as needed for painful nails.   Monitor right hallux distal scab closely.  Notify me immediately if this begins to drain ulcerate or show any signs of infection.  Return in about 4 months (around 03/21/2024) for at risk  diabetic foot care.

## 2023-11-21 DIAGNOSIS — E1165 Type 2 diabetes mellitus with hyperglycemia: Secondary | ICD-10-CM | POA: Diagnosis not present

## 2023-11-28 DIAGNOSIS — E1165 Type 2 diabetes mellitus with hyperglycemia: Secondary | ICD-10-CM | POA: Diagnosis not present

## 2023-11-28 DIAGNOSIS — E7800 Pure hypercholesterolemia, unspecified: Secondary | ICD-10-CM | POA: Diagnosis not present

## 2023-11-28 DIAGNOSIS — E1140 Type 2 diabetes mellitus with diabetic neuropathy, unspecified: Secondary | ICD-10-CM | POA: Diagnosis not present

## 2023-11-28 DIAGNOSIS — I10 Essential (primary) hypertension: Secondary | ICD-10-CM | POA: Diagnosis not present

## 2023-11-28 DIAGNOSIS — E11319 Type 2 diabetes mellitus with unspecified diabetic retinopathy without macular edema: Secondary | ICD-10-CM | POA: Diagnosis not present

## 2024-01-31 ENCOUNTER — Encounter (INDEPENDENT_AMBULATORY_CARE_PROVIDER_SITE_OTHER): Payer: PPO | Admitting: Ophthalmology

## 2024-01-31 DIAGNOSIS — H43813 Vitreous degeneration, bilateral: Secondary | ICD-10-CM | POA: Diagnosis not present

## 2024-01-31 DIAGNOSIS — H2513 Age-related nuclear cataract, bilateral: Secondary | ICD-10-CM | POA: Diagnosis not present

## 2024-01-31 DIAGNOSIS — I10 Essential (primary) hypertension: Secondary | ICD-10-CM | POA: Diagnosis not present

## 2024-01-31 DIAGNOSIS — Z7984 Long term (current) use of oral hypoglycemic drugs: Secondary | ICD-10-CM

## 2024-01-31 DIAGNOSIS — E113393 Type 2 diabetes mellitus with moderate nonproliferative diabetic retinopathy without macular edema, bilateral: Secondary | ICD-10-CM | POA: Diagnosis not present

## 2024-01-31 DIAGNOSIS — Z794 Long term (current) use of insulin: Secondary | ICD-10-CM

## 2024-01-31 DIAGNOSIS — D3132 Benign neoplasm of left choroid: Secondary | ICD-10-CM

## 2024-01-31 DIAGNOSIS — H35033 Hypertensive retinopathy, bilateral: Secondary | ICD-10-CM

## 2024-03-24 ENCOUNTER — Ambulatory Visit: Admitting: Podiatry

## 2024-03-24 VITALS — Ht 74.0 in | Wt 196.0 lb

## 2024-03-24 DIAGNOSIS — L84 Corns and callosities: Secondary | ICD-10-CM | POA: Diagnosis not present

## 2024-03-24 DIAGNOSIS — M79674 Pain in right toe(s): Secondary | ICD-10-CM

## 2024-03-24 DIAGNOSIS — E11610 Type 2 diabetes mellitus with diabetic neuropathic arthropathy: Secondary | ICD-10-CM

## 2024-03-24 DIAGNOSIS — M79675 Pain in left toe(s): Secondary | ICD-10-CM

## 2024-03-24 DIAGNOSIS — B351 Tinea unguium: Secondary | ICD-10-CM | POA: Diagnosis not present

## 2024-03-24 NOTE — Progress Notes (Signed)
    Subjective:  Patient ID: Aaron [name], male    DOB: [birth year],  MRN: [id number]     73 y.o. male returns for follow-up.  Reports no new ulceration.  He has elongated thickened toenails that are difficult for him to cut.  Calluses on the right great toe and at the prior wound site still present as well as the left foot.   Previous injury of right hallux has healed with a callus  Objective:  Physical Exam: warm, good capillary refill and normal DP and PT pulses.   first interspace wound remains completely healed.  Onychomycosis with thickened elongated nail plates k89 with yellow toenails and subungual debris.  Hyperkeratosis in the first interspace and the medial hallux on the right foot.  Distal hallux as well as right foot.  Submetatarsal 5 on the left foot.  All calluses thickened there is no hemorrhage deep to this.  No active ulceration.  Thin shiny atrophic skin and minimal pedal hair growth  Assessment:   1. Pain due to onychomycosis of toenails of both feet   2. Type 2 diabetes mellitus with diabetic neuropathic arthropathy, without long-term current use of insulin  (HCC)   3. Pre-ulcerative calluses        Plan:  Patient was evaluated and treated and all questions answered.  All symptomatic hyperkeratoses were safely debrided with a sterile #15 blade to patient's level of comfort without incident.  Continue using O'Keefe's foot cream and pumice stone   Discussed the etiology and treatment options for the condition in detail with the patient.. Recommended debridement of the nails today. Sharp and mechanical debridement performed of all painful and mycotic nails today. Nails debrided in length and thickness using a nail nipper to level of comfort. Discussed treatment options including appropriate shoe gear. Follow up as needed for painful nails.   Due to his neuropathy and diabetes and prior ulceration history these are medically necessary.    Return in about 4 months  (around 07/24/2024) for at risk diabetic foot care.

## 2024-04-07 DIAGNOSIS — E11319 Type 2 diabetes mellitus with unspecified diabetic retinopathy without macular edema: Secondary | ICD-10-CM | POA: Diagnosis not present

## 2024-04-07 DIAGNOSIS — E1140 Type 2 diabetes mellitus with diabetic neuropathy, unspecified: Secondary | ICD-10-CM | POA: Diagnosis not present

## 2024-04-07 DIAGNOSIS — I10 Essential (primary) hypertension: Secondary | ICD-10-CM | POA: Diagnosis not present

## 2024-04-07 DIAGNOSIS — E7800 Pure hypercholesterolemia, unspecified: Secondary | ICD-10-CM | POA: Diagnosis not present

## 2024-04-07 DIAGNOSIS — E669 Obesity, unspecified: Secondary | ICD-10-CM | POA: Diagnosis not present

## 2024-04-07 DIAGNOSIS — E1165 Type 2 diabetes mellitus with hyperglycemia: Secondary | ICD-10-CM | POA: Diagnosis not present

## 2024-04-07 DIAGNOSIS — N529 Male erectile dysfunction, unspecified: Secondary | ICD-10-CM | POA: Diagnosis not present

## 2024-05-12 DIAGNOSIS — H524 Presbyopia: Secondary | ICD-10-CM | POA: Diagnosis not present

## 2024-05-12 DIAGNOSIS — E113293 Type 2 diabetes mellitus with mild nonproliferative diabetic retinopathy without macular edema, bilateral: Secondary | ICD-10-CM | POA: Diagnosis not present

## 2024-05-12 DIAGNOSIS — H25813 Combined forms of age-related cataract, bilateral: Secondary | ICD-10-CM | POA: Diagnosis not present

## 2024-05-12 DIAGNOSIS — H33323 Round hole, bilateral: Secondary | ICD-10-CM | POA: Diagnosis not present

## 2024-05-12 DIAGNOSIS — D3132 Benign neoplasm of left choroid: Secondary | ICD-10-CM | POA: Diagnosis not present

## 2024-07-24 ENCOUNTER — Ambulatory Visit: Admitting: Podiatry

## 2024-07-24 VITALS — Ht 74.0 in | Wt 196.0 lb

## 2024-07-24 DIAGNOSIS — M79674 Pain in right toe(s): Secondary | ICD-10-CM | POA: Diagnosis not present

## 2024-07-24 DIAGNOSIS — B351 Tinea unguium: Secondary | ICD-10-CM

## 2024-07-24 DIAGNOSIS — L84 Corns and callosities: Secondary | ICD-10-CM | POA: Diagnosis not present

## 2024-07-24 DIAGNOSIS — E11610 Type 2 diabetes mellitus with diabetic neuropathic arthropathy: Secondary | ICD-10-CM

## 2024-07-24 DIAGNOSIS — M79675 Pain in left toe(s): Secondary | ICD-10-CM | POA: Diagnosis not present

## 2024-07-26 NOTE — Progress Notes (Signed)
" °  °  Subjective:  Patient ID: Aaron [name], male    DOB: April 04, 1951,  MRN: [id number]     74 y.o. male returns for follow-up.  Reports no new ulceration.  He has elongated thickened toenails that are difficult for him to cut.  Calluses on the right great toe and at the prior wound site still present as well as the left foot.      Objective:  Physical Exam: warm, good capillary refill and normal DP and PT pulses.   first interspace wound remains completely healed.  Onychomycosis with thickened elongated nail plates k89 with yellow toenails and subungual debris.  Hyperkeratosis in the first interspace and the medial hallux on the right foot.  Distal hallux as well as right foot.  Submetatarsal 5 on the left foot.  All calluses thickened there is no hemorrhage deep to this.  No active ulceration.  Thin shiny atrophic skin and minimal pedal hair growth.  Absent protective sensation Assessment:   1. Callus of foot   2. Type 2 diabetes mellitus with diabetic neuropathic arthropathy, without long-term current use of insulin  (HCC)   3. Pain due to onychomycosis of toenails of both feet        Plan:  Patient was evaluated and treated and all questions answered.  All symptomatic hyperkeratoses were safely debrided with a sterile #15 blade to patient's level of comfort without incident.  Continue using O'Keefe's foot cream and pumice stone   Discussed the etiology and treatment options for the condition in detail with the patient.. Recommended debridement of the nails today. Sharp and mechanical debridement performed of all painful and mycotic nails today. Nails debrided in length and thickness using a nail nipper to level of comfort. Discussed treatment options including appropriate shoe gear. Follow up as needed for painful nails.   Due to his neuropathy and diabetes and prior ulceration history these are medically necessary.    Return in about 4 months (around 11/21/2024).  "

## 2024-07-31 ENCOUNTER — Encounter (INDEPENDENT_AMBULATORY_CARE_PROVIDER_SITE_OTHER): Admitting: Ophthalmology

## 2024-07-31 DIAGNOSIS — H43813 Vitreous degeneration, bilateral: Secondary | ICD-10-CM | POA: Diagnosis not present

## 2024-07-31 DIAGNOSIS — Z794 Long term (current) use of insulin: Secondary | ICD-10-CM

## 2024-07-31 DIAGNOSIS — H2513 Age-related nuclear cataract, bilateral: Secondary | ICD-10-CM | POA: Diagnosis not present

## 2024-07-31 DIAGNOSIS — D3132 Benign neoplasm of left choroid: Secondary | ICD-10-CM | POA: Diagnosis not present

## 2024-07-31 DIAGNOSIS — E113393 Type 2 diabetes mellitus with moderate nonproliferative diabetic retinopathy without macular edema, bilateral: Secondary | ICD-10-CM | POA: Diagnosis not present

## 2024-07-31 DIAGNOSIS — I10 Essential (primary) hypertension: Secondary | ICD-10-CM | POA: Diagnosis not present

## 2024-07-31 DIAGNOSIS — Z7984 Long term (current) use of oral hypoglycemic drugs: Secondary | ICD-10-CM

## 2024-07-31 DIAGNOSIS — H35033 Hypertensive retinopathy, bilateral: Secondary | ICD-10-CM | POA: Diagnosis not present

## 2024-11-20 ENCOUNTER — Ambulatory Visit: Admitting: Podiatry

## 2025-01-29 ENCOUNTER — Encounter (INDEPENDENT_AMBULATORY_CARE_PROVIDER_SITE_OTHER): Admitting: Ophthalmology
